# Patient Record
Sex: Female | Born: 1960 | Race: White | Hispanic: No | Marital: Married | Smoking: Never smoker
Health system: Southern US, Community
[De-identification: ages and names within clinical notes are randomized; demographics above are authoritative.]

## PROBLEM LIST (undated history)

## (undated) DIAGNOSIS — G43909 Migraine, unspecified, not intractable, without status migrainosus: Secondary | ICD-10-CM

## (undated) DIAGNOSIS — E039 Hypothyroidism, unspecified: Secondary | ICD-10-CM

## (undated) DIAGNOSIS — R079 Chest pain, unspecified: Secondary | ICD-10-CM

## (undated) DIAGNOSIS — E079 Disorder of thyroid, unspecified: Secondary | ICD-10-CM

## (undated) HISTORY — DX: Hypothyroidism, unspecified: E03.9

## (undated) HISTORY — DX: Migraine, unspecified, not intractable, without status migrainosus: G43.909

## (undated) HISTORY — DX: Disorder of thyroid, unspecified: E07.9

## (undated) HISTORY — DX: Chest pain, unspecified: R07.9

---

## 2012-07-24 ENCOUNTER — Other Ambulatory Visit (HOSPITAL_BASED_OUTPATIENT_CLINIC_OR_DEPARTMENT_OTHER): Payer: Self-pay | Admitting: Family Medicine

## 2012-07-24 ENCOUNTER — Ambulatory Visit (HOSPITAL_BASED_OUTPATIENT_CLINIC_OR_DEPARTMENT_OTHER)
Admission: RE | Admit: 2012-07-24 | Discharge: 2012-07-24 | Disposition: A | Discharge status: Home / Self Care | Payer: BC Managed Care – PPO | Source: Ambulatory Visit | Attending: Family Medicine | Admitting: Family Medicine

## 2012-07-24 DIAGNOSIS — M79609 Pain in unspecified limb: Secondary | ICD-10-CM

## 2012-07-24 DIAGNOSIS — Z1231 Encounter for screening mammogram for malignant neoplasm of breast: Secondary | ICD-10-CM

## 2012-08-01 ENCOUNTER — Other Ambulatory Visit: Payer: Self-pay | Admitting: Family Medicine

## 2012-08-01 DIAGNOSIS — R928 Other abnormal and inconclusive findings on diagnostic imaging of breast: Secondary | ICD-10-CM

## 2012-08-07 ENCOUNTER — Ambulatory Visit
Admission: RE | Admit: 2012-08-07 | Discharge: 2012-08-07 | Disposition: A | Discharge status: Home / Self Care | Payer: BC Managed Care – PPO | Source: Ambulatory Visit | Attending: Family Medicine | Admitting: Family Medicine

## 2012-08-07 DIAGNOSIS — R928 Other abnormal and inconclusive findings on diagnostic imaging of breast: Secondary | ICD-10-CM

## 2013-09-24 ENCOUNTER — Encounter: Payer: Self-pay | Admitting: Cardiovascular Disease

## 2013-09-24 ENCOUNTER — Encounter: Payer: Self-pay | Admitting: *Deleted

## 2013-09-24 ENCOUNTER — Ambulatory Visit (INDEPENDENT_AMBULATORY_CARE_PROVIDER_SITE_OTHER): Payer: BC Managed Care – PPO | Admitting: Cardiovascular Disease

## 2013-09-24 VITALS — BP 120/76 | HR 64 | Ht 64.0 in | Wt 158.8 lb

## 2013-09-24 DIAGNOSIS — IMO0001 Reserved for inherently not codable concepts without codable children: Secondary | ICD-10-CM

## 2013-09-24 DIAGNOSIS — R9431 Abnormal electrocardiogram [ECG] [EKG]: Secondary | ICD-10-CM

## 2013-09-24 DIAGNOSIS — R079 Chest pain, unspecified: Secondary | ICD-10-CM | POA: Insufficient documentation

## 2013-09-24 DIAGNOSIS — R030 Elevated blood-pressure reading, without diagnosis of hypertension: Secondary | ICD-10-CM

## 2013-09-24 DIAGNOSIS — G479 Sleep disorder, unspecified: Secondary | ICD-10-CM

## 2013-09-24 DIAGNOSIS — E039 Hypothyroidism, unspecified: Secondary | ICD-10-CM

## 2013-09-24 LAB — D-DIMER, QUANTITATIVE: D-Dimer, Quant: <0.27 ug/mL-FEU (ref 0.00–0.48)

## 2013-09-24 NOTE — Patient Instructions (Addendum)
Your physician recommends that you schedule a follow-up appointment in: AS NEEDED  Your physician recommends that you continue on your current medications as directed. Please refer to the Current Medication list given to you today.  Your physician recommends that you return for lab work in: D DIMER Your physician has requested that you have a stress echocardiogram. For further information please visit [email or web address] Please follow instruction sheet as given.

## 2013-09-24 NOTE — Assessment & Plan Note (Signed)
No old one to compare anterior leads may be lead position Nonspecific inferior T wave changes  Will assess when she has her stress echo for structural heart disease

## 2013-09-24 NOTE — Assessment & Plan Note (Signed)
Continue replacement TSH with primary

## 2013-09-24 NOTE — Progress Notes (Signed)
Patient ID: Regina King, female   DOB: July 03, 1961, 52 y.o.   MRN: [id number]  52 yo referred by NP Sherrine Maples.  Has had intermitant chest pain.  Started at beginning of December.  Fleeting sharp "piercing pain.  Lasting seconds All episodes are Fleeting some associated with dyspnea  And dizzyness  Father had MI at age 52  Has done a lot of traveling to [place] this year BP elevated when traveling  She is.   under a lot Of stress.  Raising an 11/ and 53 yo.  Mom in [place] had a stroke and she has to call there 3x/day to assist  No smoking, ETOH excess.  No calf pain edema or pleuritic component. She did have left quad pain a few months ago but gone now.   No GI overtones Not positional     ROS: Denies fever, malais, weight loss, blurry vision, decreased visual acuity, cough, sputum, SOB, hemoptysis, pleuritic pain, palpitaitons, heartburn, abdominal pain, melena, lower extremity edema, claudication, or rash.  All other systems reviewed and negative   General: Affect appropriate Healthy:  appears stated age HEENT: normal Neck supple with no adenopathy JVP normal no bruits no thyromegaly Lungs clear with no wheezing and good diaphragmatic motion Heart:  S1/S2 no murmur,rub, gallop or click PMI normal Abdomen: benighn, BS positve, no tenderness, no AAA no bruit.  No HSM or HJR Distal pulses intact with no bruits No edema Neuro non-focal Skin warm and dry No muscular weakness  Medications Current Outpatient Prescriptions  Medication Sig Dispense Refill  . levothyroxine (SYNTHROID, LEVOTHROID) 75 MCG tablet Take 75 mcg by mouth daily before breakfast.       No current facility-administered medications for this visit.    Allergies Asa  Family History: Family History  Problem Relation Age of Onset  . Arrhythmia Mother   . Heart attack Father     Social History: History   Social History  . Marital Status: Married    Spouse Name: N/A    Number of Children: N/A  . Years  of Education: N/A   Occupational History  . Not on file.   Social History Main Topics  . Smoking status: Never Smoker   . Smokeless tobacco: Never Used  . Alcohol Use: No  . Drug Use: No  . Sexual Activity: Not on file   Other Topics Concern  . Not on file   Social History Narrative  . No narrative on file    Electrocardiogram:  SR rate 60 poor R wave progression inferior T wave changes   Assessment and Plan

## 2013-09-24 NOTE — Assessment & Plan Note (Signed)
Seems reactional to stress Normal most of the time  Do not think Rx outside of anxiety/depression needed  Discussed low sodium diet

## 2013-09-24 NOTE — Assessment & Plan Note (Signed)
Very atypical Abnormal ECG.  Check d dimer and stress echo

## 2013-09-25 ENCOUNTER — Telehealth: Payer: Self-pay | Admitting: Cardiovascular Disease

## 2013-09-25 NOTE — Telephone Encounter (Signed)
Follow Up  ° °Pt returned call for results °

## 2013-09-25 NOTE — Telephone Encounter (Signed)
PT  AWARE OF  D DIMER RESULTS .Regina King

## 2013-10-17 ENCOUNTER — Encounter: Payer: Self-pay | Admitting: Internal Medicine

## 2013-10-17 ENCOUNTER — Ambulatory Visit (HOSPITAL_COMMUNITY): Payer: BC Managed Care – PPO | Attending: Cardiovascular Disease

## 2013-10-17 DIAGNOSIS — R42 Dizziness and giddiness: Secondary | ICD-10-CM | POA: Insufficient documentation

## 2013-10-17 DIAGNOSIS — R079 Chest pain, unspecified: Secondary | ICD-10-CM

## 2013-10-17 DIAGNOSIS — R072 Precordial pain: Secondary | ICD-10-CM | POA: Insufficient documentation

## 2013-10-17 DIAGNOSIS — Z8249 Family history of ischemic heart disease and other diseases of the circulatory system: Secondary | ICD-10-CM | POA: Insufficient documentation

## 2013-10-17 DIAGNOSIS — R0609 Other forms of dyspnea: Secondary | ICD-10-CM | POA: Insufficient documentation

## 2013-10-17 DIAGNOSIS — R0989 Other specified symptoms and signs involving the circulatory and respiratory systems: Secondary | ICD-10-CM | POA: Insufficient documentation

## 2013-10-17 NOTE — Progress Notes (Signed)
Stress Echocardiogram performed.  

## 2015-08-17 ENCOUNTER — Other Ambulatory Visit (HOSPITAL_BASED_OUTPATIENT_CLINIC_OR_DEPARTMENT_OTHER): Payer: Self-pay | Admitting: Family Medicine

## 2015-08-17 DIAGNOSIS — R1033 Periumbilical pain: Secondary | ICD-10-CM

## 2015-08-19 ENCOUNTER — Ambulatory Visit (HOSPITAL_BASED_OUTPATIENT_CLINIC_OR_DEPARTMENT_OTHER)
Admission: RE | Admit: 2015-08-19 | Discharge: 2015-08-19 | Disposition: A | Discharge status: Home / Self Care | Payer: BLUE CROSS/BLUE SHIELD | Source: Ambulatory Visit | Attending: Family Medicine | Admitting: Family Medicine

## 2015-08-19 DIAGNOSIS — R932 Abnormal findings on diagnostic imaging of liver and biliary tract: Secondary | ICD-10-CM | POA: Insufficient documentation

## 2015-08-19 DIAGNOSIS — R1905 Periumbilic swelling, mass or lump: Secondary | ICD-10-CM | POA: Insufficient documentation

## 2015-08-19 DIAGNOSIS — R1033 Periumbilical pain: Secondary | ICD-10-CM

## 2015-08-23 ENCOUNTER — Other Ambulatory Visit (HOSPITAL_BASED_OUTPATIENT_CLINIC_OR_DEPARTMENT_OTHER): Payer: Self-pay | Admitting: Family Medicine

## 2015-08-23 DIAGNOSIS — R1909 Other intra-abdominal and pelvic swelling, mass and lump: Secondary | ICD-10-CM

## 2015-09-07 ENCOUNTER — Ambulatory Visit (HOSPITAL_BASED_OUTPATIENT_CLINIC_OR_DEPARTMENT_OTHER): Admission: RE | Admit: 2015-09-07 | Payer: BLUE CROSS/BLUE SHIELD | Source: Ambulatory Visit

## 2015-10-08 ENCOUNTER — Other Ambulatory Visit (HOSPITAL_BASED_OUTPATIENT_CLINIC_OR_DEPARTMENT_OTHER): Payer: Self-pay | Admitting: Family Medicine

## 2015-10-08 DIAGNOSIS — R1900 Intra-abdominal and pelvic swelling, mass and lump, unspecified site: Secondary | ICD-10-CM

## 2015-10-16 ENCOUNTER — Ambulatory Visit (HOSPITAL_BASED_OUTPATIENT_CLINIC_OR_DEPARTMENT_OTHER): Payer: BLUE CROSS/BLUE SHIELD

## 2015-10-23 ENCOUNTER — Ambulatory Visit (HOSPITAL_BASED_OUTPATIENT_CLINIC_OR_DEPARTMENT_OTHER)
Admission: RE | Admit: 2015-10-23 | Discharge: 2015-10-23 | Disposition: A | Discharge status: Home / Self Care | Payer: BLUE CROSS/BLUE SHIELD | Source: Ambulatory Visit | Attending: Family Medicine | Admitting: Family Medicine

## 2015-10-23 ENCOUNTER — Other Ambulatory Visit (HOSPITAL_BASED_OUTPATIENT_CLINIC_OR_DEPARTMENT_OTHER): Payer: Self-pay | Admitting: Family Medicine

## 2015-10-23 DIAGNOSIS — R1900 Intra-abdominal and pelvic swelling, mass and lump, unspecified site: Secondary | ICD-10-CM

## 2015-10-23 DIAGNOSIS — K439 Ventral hernia without obstruction or gangrene: Secondary | ICD-10-CM | POA: Diagnosis not present

## 2016-02-22 DIAGNOSIS — Z23 Encounter for immunization: Secondary | ICD-10-CM | POA: Diagnosis not present

## 2016-08-28 DIAGNOSIS — R05 Cough: Secondary | ICD-10-CM | POA: Diagnosis not present

## 2016-08-28 DIAGNOSIS — R0781 Pleurodynia: Secondary | ICD-10-CM | POA: Diagnosis not present

## 2016-11-22 DIAGNOSIS — E038 Other specified hypothyroidism: Secondary | ICD-10-CM | POA: Diagnosis not present

## 2016-11-22 DIAGNOSIS — E559 Vitamin D deficiency, unspecified: Secondary | ICD-10-CM | POA: Diagnosis not present

## 2017-01-24 DIAGNOSIS — R030 Elevated blood-pressure reading, without diagnosis of hypertension: Secondary | ICD-10-CM | POA: Diagnosis not present

## 2017-01-24 DIAGNOSIS — E039 Hypothyroidism, unspecified: Secondary | ICD-10-CM | POA: Diagnosis not present

## 2017-01-24 DIAGNOSIS — E559 Vitamin D deficiency, unspecified: Secondary | ICD-10-CM | POA: Diagnosis not present

## 2017-08-20 DIAGNOSIS — Z1211 Encounter for screening for malignant neoplasm of colon: Secondary | ICD-10-CM | POA: Diagnosis not present

## 2017-08-20 DIAGNOSIS — K640 First degree hemorrhoids: Secondary | ICD-10-CM | POA: Diagnosis not present

## 2017-08-20 DIAGNOSIS — Z8371 Family history of colonic polyps: Secondary | ICD-10-CM | POA: Diagnosis not present

## 2017-08-20 DIAGNOSIS — K5730 Diverticulosis of large intestine without perforation or abscess without bleeding: Secondary | ICD-10-CM | POA: Diagnosis not present

## 2017-08-24 ENCOUNTER — Other Ambulatory Visit: Payer: Self-pay | Admitting: Family Medicine

## 2017-08-24 DIAGNOSIS — Z139 Encounter for screening, unspecified: Secondary | ICD-10-CM

## 2017-09-20 ENCOUNTER — Ambulatory Visit: Payer: BLUE CROSS/BLUE SHIELD

## 2017-09-20 ENCOUNTER — Ambulatory Visit
Admission: RE | Admit: 2017-09-20 | Discharge: 2017-09-20 | Disposition: A | Discharge status: Home / Self Care | Payer: BLUE CROSS/BLUE SHIELD | Source: Ambulatory Visit | Attending: Family Medicine | Admitting: Family Medicine

## 2017-09-20 DIAGNOSIS — Z139 Encounter for screening, unspecified: Secondary | ICD-10-CM

## 2017-09-20 DIAGNOSIS — Z1231 Encounter for screening mammogram for malignant neoplasm of breast: Secondary | ICD-10-CM | POA: Diagnosis not present

## 2018-01-21 DIAGNOSIS — E559 Vitamin D deficiency, unspecified: Secondary | ICD-10-CM | POA: Diagnosis not present

## 2018-01-21 DIAGNOSIS — E039 Hypothyroidism, unspecified: Secondary | ICD-10-CM | POA: Diagnosis not present

## 2018-01-21 DIAGNOSIS — R011 Cardiac murmur, unspecified: Secondary | ICD-10-CM | POA: Diagnosis not present

## 2018-01-21 DIAGNOSIS — E7800 Pure hypercholesterolemia, unspecified: Secondary | ICD-10-CM | POA: Diagnosis not present

## 2018-02-05 DIAGNOSIS — E7800 Pure hypercholesterolemia, unspecified: Secondary | ICD-10-CM | POA: Diagnosis not present

## 2018-02-07 DIAGNOSIS — R011 Cardiac murmur, unspecified: Secondary | ICD-10-CM | POA: Diagnosis not present

## 2018-05-14 DIAGNOSIS — E7800 Pure hypercholesterolemia, unspecified: Secondary | ICD-10-CM | POA: Diagnosis not present

## 2018-05-14 DIAGNOSIS — E039 Hypothyroidism, unspecified: Secondary | ICD-10-CM | POA: Diagnosis not present

## 2018-05-14 DIAGNOSIS — E559 Vitamin D deficiency, unspecified: Secondary | ICD-10-CM | POA: Diagnosis not present

## 2018-05-20 DIAGNOSIS — H5315 Visual distortions of shape and size: Secondary | ICD-10-CM | POA: Diagnosis not present

## 2018-05-20 DIAGNOSIS — G43B1 Ophthalmoplegic migraine, intractable: Secondary | ICD-10-CM | POA: Diagnosis not present

## 2018-07-24 DIAGNOSIS — E039 Hypothyroidism, unspecified: Secondary | ICD-10-CM | POA: Diagnosis not present

## 2018-07-24 DIAGNOSIS — Z130 Encounter for screening for diseases of the blood and blood-forming organs and certain disorders involving the immune mechanism: Secondary | ICD-10-CM | POA: Diagnosis not present

## 2018-07-24 DIAGNOSIS — E559 Vitamin D deficiency, unspecified: Secondary | ICD-10-CM | POA: Diagnosis not present

## 2018-07-24 DIAGNOSIS — E7800 Pure hypercholesterolemia, unspecified: Secondary | ICD-10-CM | POA: Diagnosis not present

## 2018-11-18 ENCOUNTER — Other Ambulatory Visit: Payer: Self-pay | Admitting: Family Medicine

## 2018-11-18 DIAGNOSIS — Z1231 Encounter for screening mammogram for malignant neoplasm of breast: Secondary | ICD-10-CM

## 2018-12-10 ENCOUNTER — Ambulatory Visit
Admission: RE | Admit: 2018-12-10 | Discharge: 2018-12-10 | Disposition: A | Discharge status: Home / Self Care | Payer: BLUE CROSS/BLUE SHIELD | Source: Ambulatory Visit | Attending: Family Medicine | Admitting: Family Medicine

## 2018-12-10 DIAGNOSIS — Z1231 Encounter for screening mammogram for malignant neoplasm of breast: Secondary | ICD-10-CM

## 2018-12-15 DIAGNOSIS — R079 Chest pain, unspecified: Secondary | ICD-10-CM | POA: Diagnosis not present

## 2018-12-15 DIAGNOSIS — E7800 Pure hypercholesterolemia, unspecified: Secondary | ICD-10-CM | POA: Diagnosis not present

## 2019-02-14 DIAGNOSIS — W57XXXA Bitten or stung by nonvenomous insect and other nonvenomous arthropods, initial encounter: Secondary | ICD-10-CM | POA: Diagnosis not present

## 2019-02-14 DIAGNOSIS — S30861A Insect bite (nonvenomous) of abdominal wall, initial encounter: Secondary | ICD-10-CM | POA: Diagnosis not present

## 2019-03-11 DIAGNOSIS — E039 Hypothyroidism, unspecified: Secondary | ICD-10-CM | POA: Diagnosis not present

## 2019-03-11 DIAGNOSIS — E559 Vitamin D deficiency, unspecified: Secondary | ICD-10-CM | POA: Diagnosis not present

## 2019-03-11 DIAGNOSIS — E7800 Pure hypercholesterolemia, unspecified: Secondary | ICD-10-CM | POA: Diagnosis not present

## 2019-03-11 DIAGNOSIS — Z0189 Encounter for other specified special examinations: Secondary | ICD-10-CM | POA: Diagnosis not present

## 2019-05-16 DIAGNOSIS — M1712 Unilateral primary osteoarthritis, left knee: Secondary | ICD-10-CM | POA: Diagnosis not present

## 2019-06-20 DIAGNOSIS — S83241A Other tear of medial meniscus, current injury, right knee, initial encounter: Secondary | ICD-10-CM | POA: Diagnosis not present

## 2019-07-08 DIAGNOSIS — M25562 Pain in left knee: Secondary | ICD-10-CM | POA: Diagnosis not present

## 2019-07-08 DIAGNOSIS — M1712 Unilateral primary osteoarthritis, left knee: Secondary | ICD-10-CM | POA: Diagnosis not present

## 2019-07-08 DIAGNOSIS — S83232A Complex tear of medial meniscus, current injury, left knee, initial encounter: Secondary | ICD-10-CM | POA: Diagnosis not present

## 2019-07-08 DIAGNOSIS — S83241A Other tear of medial meniscus, current injury, right knee, initial encounter: Secondary | ICD-10-CM | POA: Diagnosis not present

## 2019-07-11 DIAGNOSIS — M1712 Unilateral primary osteoarthritis, left knee: Secondary | ICD-10-CM | POA: Diagnosis not present

## 2019-08-27 DIAGNOSIS — E7800 Pure hypercholesterolemia, unspecified: Secondary | ICD-10-CM | POA: Diagnosis not present

## 2019-08-27 DIAGNOSIS — E039 Hypothyroidism, unspecified: Secondary | ICD-10-CM | POA: Diagnosis not present

## 2020-03-15 DIAGNOSIS — Z1152 Encounter for screening for COVID-19: Secondary | ICD-10-CM | POA: Diagnosis not present

## 2020-03-15 DIAGNOSIS — Z03818 Encounter for observation for suspected exposure to other biological agents ruled out: Secondary | ICD-10-CM | POA: Diagnosis not present

## 2020-03-16 DIAGNOSIS — Z03818 Encounter for observation for suspected exposure to other biological agents ruled out: Secondary | ICD-10-CM | POA: Diagnosis not present

## 2020-04-10 DIAGNOSIS — Z03818 Encounter for observation for suspected exposure to other biological agents ruled out: Secondary | ICD-10-CM | POA: Diagnosis not present

## 2020-05-10 ENCOUNTER — Other Ambulatory Visit: Payer: Self-pay | Admitting: Family Medicine

## 2020-05-10 DIAGNOSIS — Z1231 Encounter for screening mammogram for malignant neoplasm of breast: Secondary | ICD-10-CM

## 2020-05-20 ENCOUNTER — Other Ambulatory Visit: Payer: Self-pay

## 2020-05-20 ENCOUNTER — Ambulatory Visit
Admission: RE | Admit: 2020-05-20 | Discharge: 2020-05-20 | Disposition: A | Discharge status: Home / Self Care | Payer: BLUE CROSS/BLUE SHIELD | Source: Ambulatory Visit | Attending: Family Medicine | Admitting: Family Medicine

## 2020-05-20 ENCOUNTER — Other Ambulatory Visit: Payer: Self-pay | Admitting: Family Medicine

## 2020-05-20 DIAGNOSIS — N6459 Other signs and symptoms in breast: Secondary | ICD-10-CM

## 2020-05-20 DIAGNOSIS — Z1231 Encounter for screening mammogram for malignant neoplasm of breast: Secondary | ICD-10-CM

## 2020-05-24 DIAGNOSIS — S30860A Insect bite (nonvenomous) of lower back and pelvis, initial encounter: Secondary | ICD-10-CM | POA: Diagnosis not present

## 2020-05-24 DIAGNOSIS — W57XXXA Bitten or stung by nonvenomous insect and other nonvenomous arthropods, initial encounter: Secondary | ICD-10-CM | POA: Diagnosis not present

## 2020-05-24 DIAGNOSIS — M2550 Pain in unspecified joint: Secondary | ICD-10-CM | POA: Diagnosis not present

## 2020-06-03 ENCOUNTER — Other Ambulatory Visit: Payer: Self-pay

## 2020-06-03 ENCOUNTER — Ambulatory Visit
Admission: RE | Admit: 2020-06-03 | Discharge: 2020-06-03 | Disposition: A | Discharge status: Home / Self Care | Payer: BC Managed Care – PPO | Source: Ambulatory Visit | Attending: Family Medicine | Admitting: Family Medicine

## 2020-06-03 DIAGNOSIS — N6459 Other signs and symptoms in breast: Secondary | ICD-10-CM

## 2020-06-03 DIAGNOSIS — R922 Inconclusive mammogram: Secondary | ICD-10-CM | POA: Diagnosis not present

## 2020-06-06 DIAGNOSIS — Z20822 Contact with and (suspected) exposure to covid-19: Secondary | ICD-10-CM | POA: Diagnosis not present

## 2020-07-01 DIAGNOSIS — M25561 Pain in right knee: Secondary | ICD-10-CM | POA: Diagnosis not present

## 2020-07-02 DIAGNOSIS — M25561 Pain in right knee: Secondary | ICD-10-CM | POA: Diagnosis not present

## 2020-09-27 IMAGING — MG DIGITAL SCREENING BILATERAL MAMMOGRAM WITH CAD
4 series · 4 of 4 positions shown · non-contrast
Comparison: Previous exam(s).

CLINICAL DATA: Screening.

EXAM:
DIGITAL SCREENING BILATERAL MAMMOGRAM WITH CAD

[R MLO]
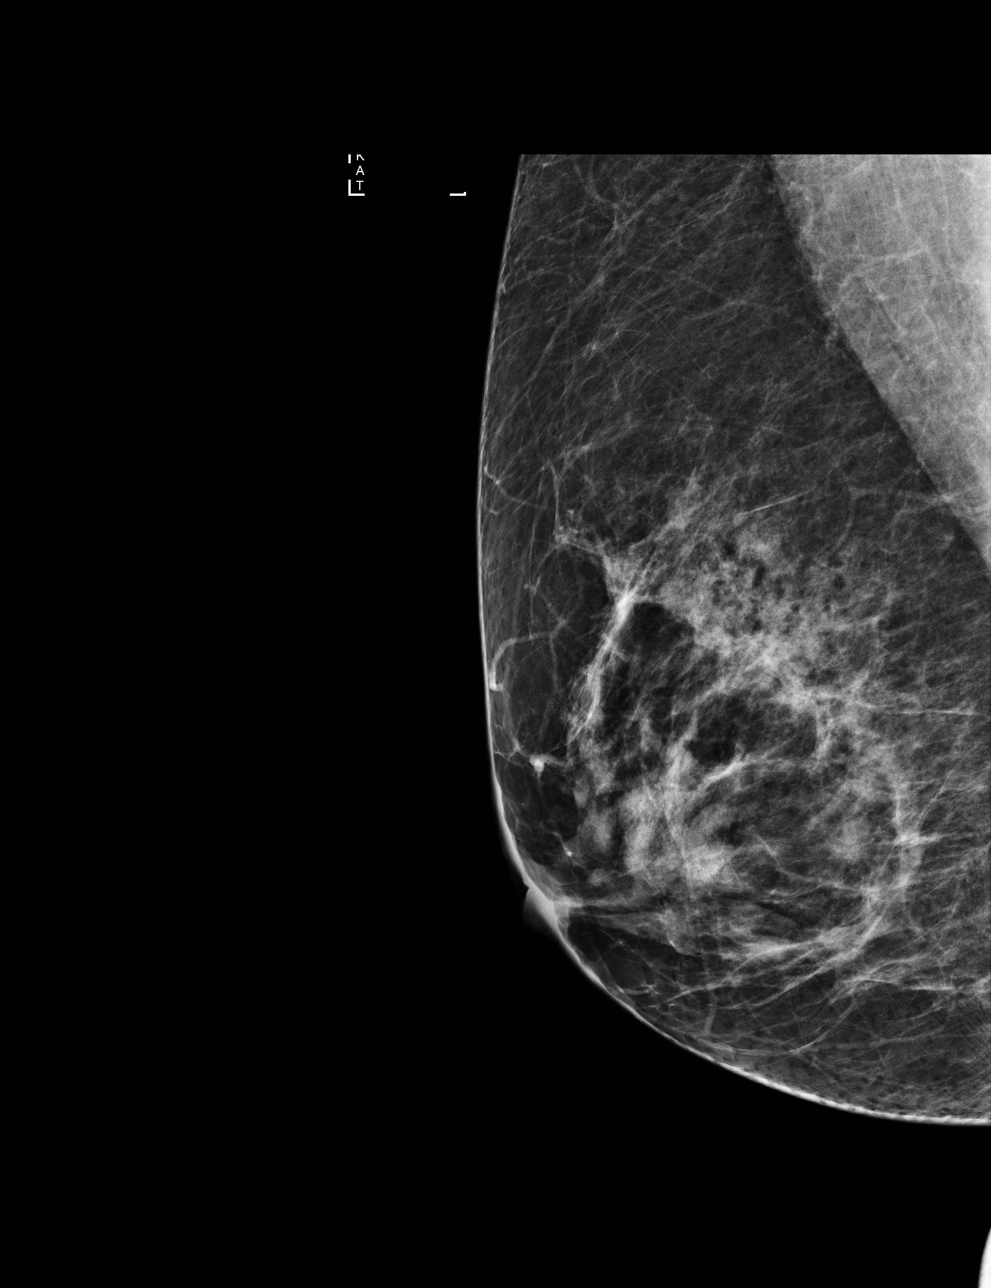

[L MLO]
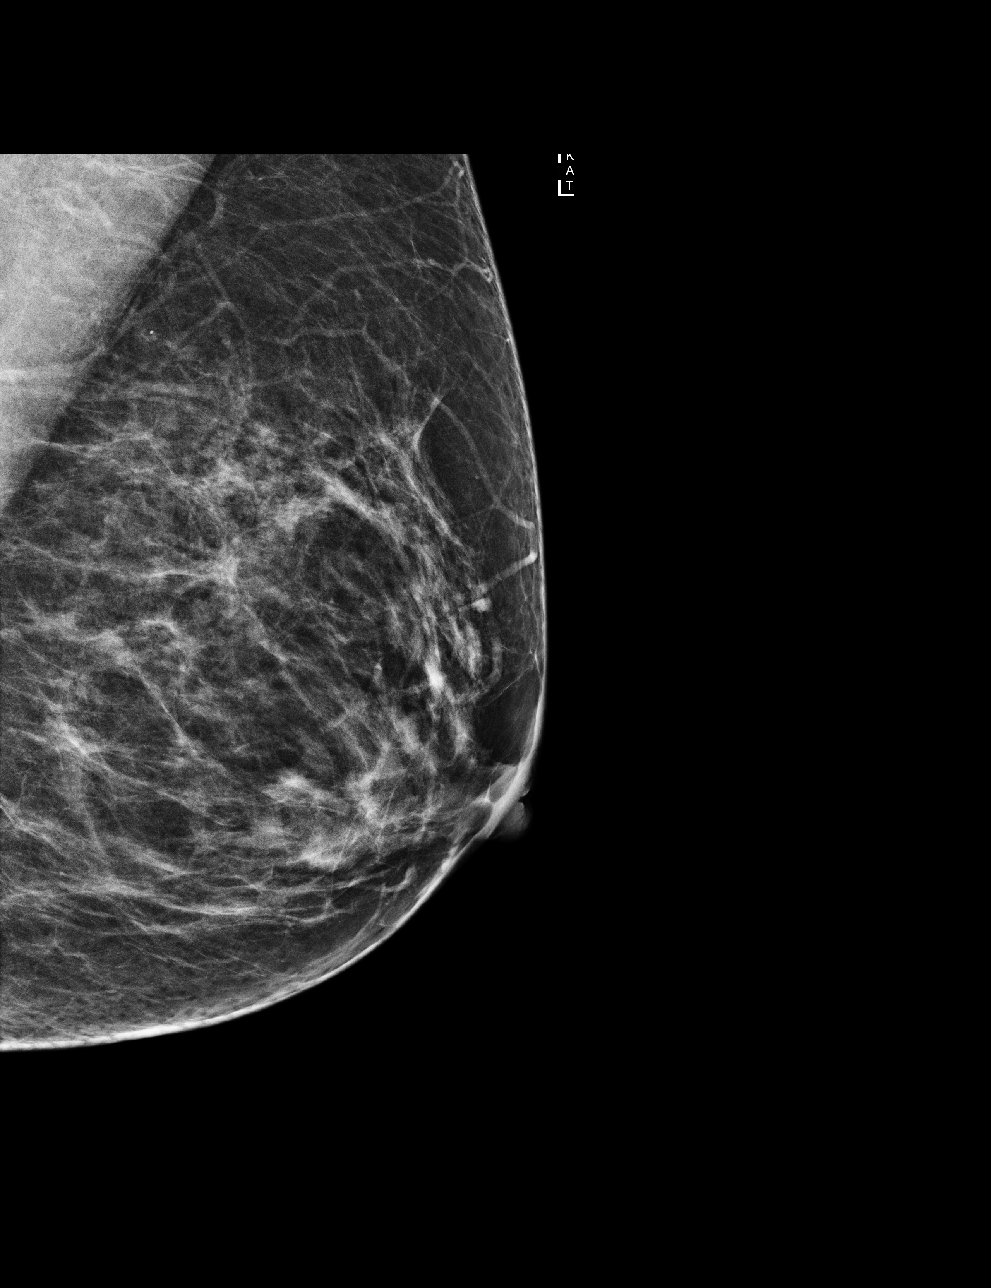

[R CC]
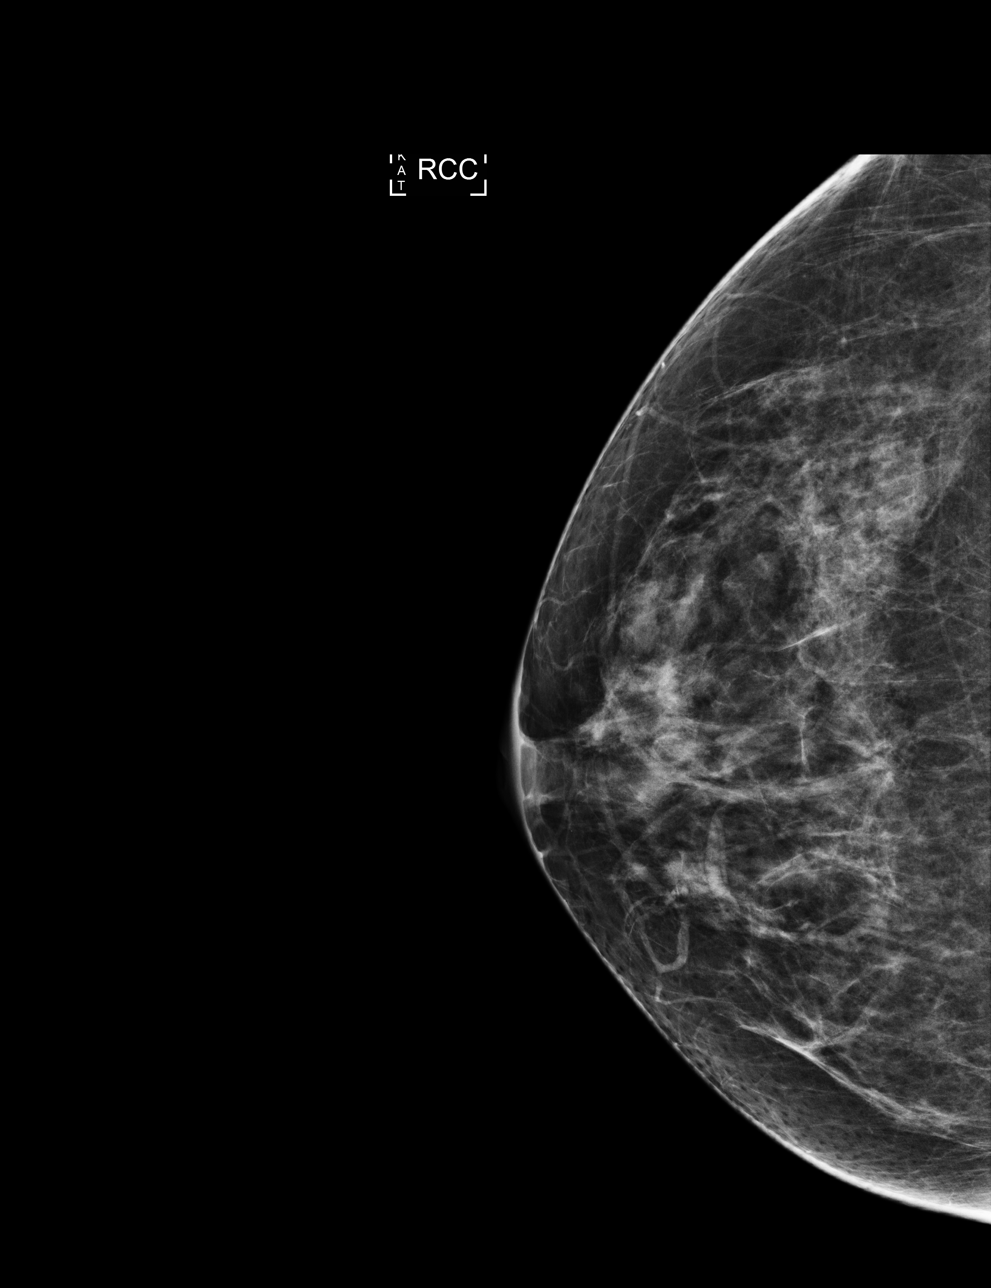

[L CC]
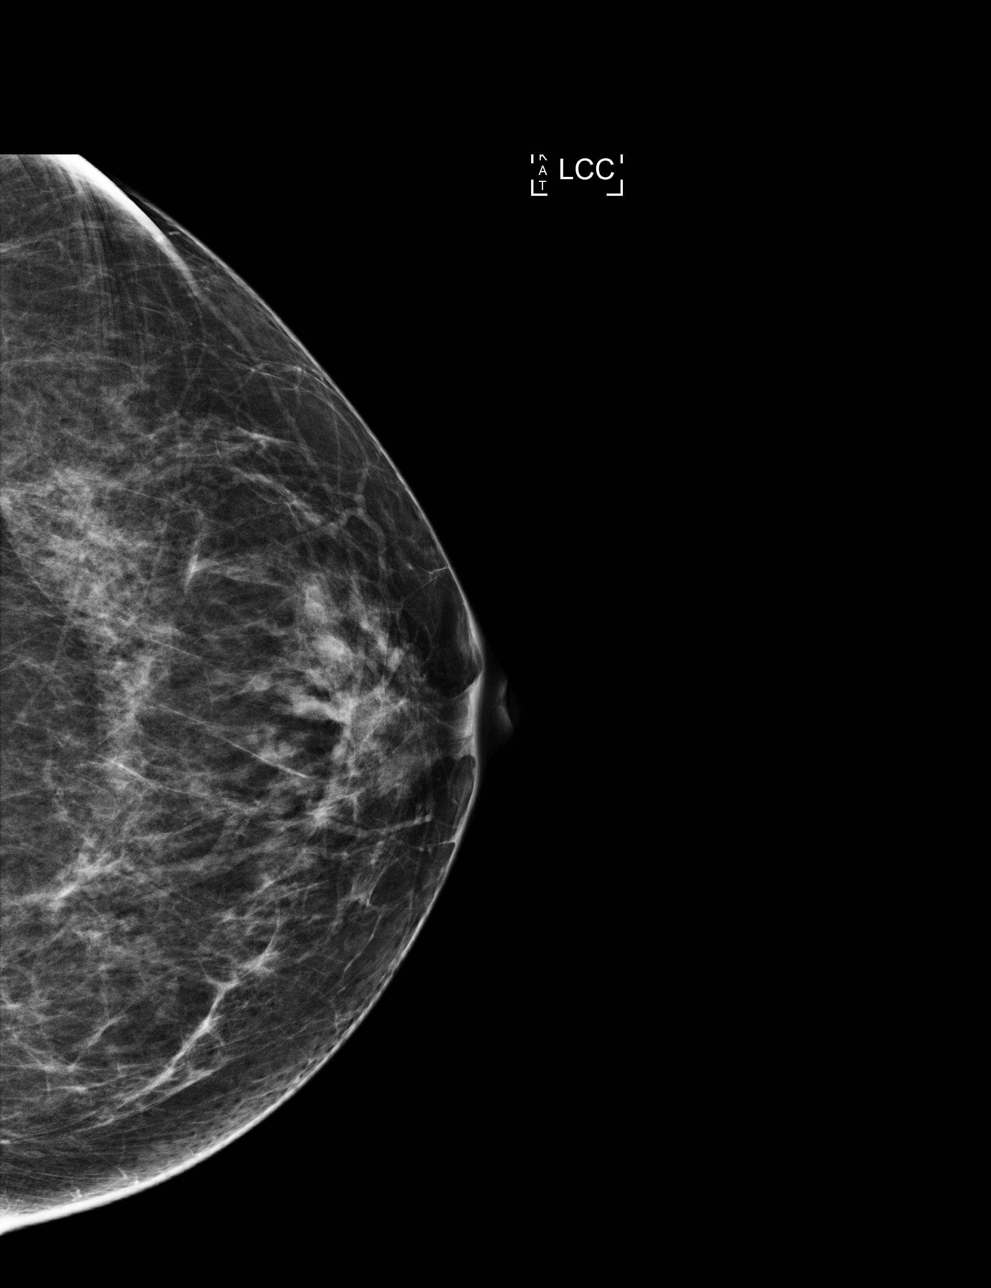

[4 of 4 positions shown; findings below may reference images not displayed]

ACR Breast Density Category b: There are scattered areas of
fibroglandular density.
FINDINGS: There are no findings suspicious for malignancy. Images were
processed with CAD.
IMPRESSION: No mammographic evidence of malignancy. A result letter of this
screening mammogram will be mailed directly to the patient.

RECOMMENDATION:
Screening mammogram in one year. (Code:AS-G-LCT)

BI-RADS CATEGORY  1: Negative.

## 2020-10-17 DIAGNOSIS — Z03818 Encounter for observation for suspected exposure to other biological agents ruled out: Secondary | ICD-10-CM | POA: Diagnosis not present

## 2020-10-17 DIAGNOSIS — Z20822 Contact with and (suspected) exposure to covid-19: Secondary | ICD-10-CM | POA: Diagnosis not present

## 2021-02-17 DIAGNOSIS — Z1211 Encounter for screening for malignant neoplasm of colon: Secondary | ICD-10-CM | POA: Diagnosis not present

## 2021-05-23 DIAGNOSIS — E7800 Pure hypercholesterolemia, unspecified: Secondary | ICD-10-CM | POA: Diagnosis not present

## 2021-05-23 DIAGNOSIS — H9202 Otalgia, left ear: Secondary | ICD-10-CM | POA: Diagnosis not present

## 2021-05-23 DIAGNOSIS — T63461A Toxic effect of venom of wasps, accidental (unintentional), initial encounter: Secondary | ICD-10-CM | POA: Diagnosis not present

## 2021-05-23 DIAGNOSIS — E039 Hypothyroidism, unspecified: Secondary | ICD-10-CM | POA: Diagnosis not present

## 2021-06-29 DIAGNOSIS — Z20822 Contact with and (suspected) exposure to covid-19: Secondary | ICD-10-CM | POA: Diagnosis not present

## 2021-07-11 DIAGNOSIS — Z03818 Encounter for observation for suspected exposure to other biological agents ruled out: Secondary | ICD-10-CM | POA: Diagnosis not present

## 2021-07-11 DIAGNOSIS — Z20822 Contact with and (suspected) exposure to covid-19: Secondary | ICD-10-CM | POA: Diagnosis not present

## 2021-08-03 ENCOUNTER — Other Ambulatory Visit: Payer: Self-pay

## 2021-08-03 ENCOUNTER — Other Ambulatory Visit (HOSPITAL_BASED_OUTPATIENT_CLINIC_OR_DEPARTMENT_OTHER): Payer: Self-pay | Admitting: Family Medicine

## 2021-08-03 ENCOUNTER — Ambulatory Visit (HOSPITAL_BASED_OUTPATIENT_CLINIC_OR_DEPARTMENT_OTHER)
Admission: RE | Admit: 2021-08-03 | Discharge: 2021-08-03 | Disposition: A | Discharge status: Home / Self Care | Payer: BC Managed Care – PPO | Source: Ambulatory Visit | Attending: Family Medicine | Admitting: Family Medicine

## 2021-08-03 DIAGNOSIS — H539 Unspecified visual disturbance: Secondary | ICD-10-CM | POA: Diagnosis not present

## 2021-08-03 DIAGNOSIS — E041 Nontoxic single thyroid nodule: Secondary | ICD-10-CM | POA: Diagnosis not present

## 2021-08-03 DIAGNOSIS — M542 Cervicalgia: Secondary | ICD-10-CM | POA: Diagnosis not present

## 2021-08-16 DIAGNOSIS — H539 Unspecified visual disturbance: Secondary | ICD-10-CM | POA: Diagnosis not present

## 2021-08-16 DIAGNOSIS — R0789 Other chest pain: Secondary | ICD-10-CM | POA: Diagnosis not present

## 2021-08-23 ENCOUNTER — Ambulatory Visit (HOSPITAL_BASED_OUTPATIENT_CLINIC_OR_DEPARTMENT_OTHER): Payer: BC Managed Care – PPO | Admitting: Cardiology

## 2021-08-23 ENCOUNTER — Other Ambulatory Visit: Payer: Self-pay

## 2021-08-23 ENCOUNTER — Encounter (HOSPITAL_BASED_OUTPATIENT_CLINIC_OR_DEPARTMENT_OTHER): Payer: Self-pay | Admitting: Cardiology

## 2021-08-23 VITALS — BP 126/80 | HR 64 | Ht 64.0 in | Wt 140.0 lb

## 2021-08-23 DIAGNOSIS — Z8249 Family history of ischemic heart disease and other diseases of the circulatory system: Secondary | ICD-10-CM | POA: Diagnosis not present

## 2021-08-23 DIAGNOSIS — R072 Precordial pain: Secondary | ICD-10-CM

## 2021-08-23 DIAGNOSIS — Z01812 Encounter for preprocedural laboratory examination: Secondary | ICD-10-CM | POA: Diagnosis not present

## 2021-08-23 DIAGNOSIS — R079 Chest pain, unspecified: Secondary | ICD-10-CM

## 2021-08-23 DIAGNOSIS — H539 Unspecified visual disturbance: Secondary | ICD-10-CM

## 2021-08-23 DIAGNOSIS — R42 Dizziness and giddiness: Secondary | ICD-10-CM

## 2021-08-23 DIAGNOSIS — E7800 Pure hypercholesterolemia, unspecified: Secondary | ICD-10-CM

## 2021-08-23 DIAGNOSIS — Z7189 Other specified counseling: Secondary | ICD-10-CM

## 2021-08-23 MED ORDER — METOPROLOL TARTRATE 25 MG PO TABS: ORAL_TABLET | ORAL | 0 refills | Status: DC
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
## 2021-08-23 NOTE — Patient Instructions (Signed)
Medication Instructions:  Your Physician recommend you continue on your current medication as directed.    *If you need a refill on your cardiac medications before your next appointment, please call your pharmacy*   Lab Work: Your provider has recommended lab work today (BMP). Please have this collected at [facility] [place]. The lab is open 8:00 am - 4:30 pm. Please avoid 12:00p - 1:00p for lunch hour. You do not need an appointment. Please go to [address] [place], [place] [address]. This is in the Primary Care office on the 3rd floor, let them know you are there for blood work and they will direct you to the lab.  If you have labs (blood work) drawn today and your tests are completely normal, you will receive your results only by: MyChart Message (if you have MyChart) OR A paper copy in the mail If you have any lab test that is abnormal or we need to change your treatment, we will call you to review the results.   Testing/Procedures: Cardiac CT Angiography (CTA), is a special type of CT scan that uses a computer to produce multi-dimensional views of major blood vessels throughout the body. In CT angiography, a contrast material is injected through an IV to help visualize the blood vessels  [facility]  Follow-Up: At [facility], you and your health needs are our priority.  As part of our continuing mission to provide you with exceptional heart care, we have created designated Provider Care Teams.  These Care Teams include your primary Cardiologist (physician) and Advanced Practice Providers (APPs -  Physician Assistants and Nurse Practitioners) who all work together to provide you with the care you need, when you need it.  We recommend signing up for the patient portal called "MyChart".  Sign up information is provided on this After Visit Summary.  MyChart is used to connect with patients for Virtual Visits (Telemedicine).  Patients are able to  view lab/test results, encounter notes, upcoming appointments, etc.  Non-urgent messages can be sent to your provider as well.   To learn more about what you can do with MyChart, go to [email or web address].    Your next appointment:   Based on test results  The format for your next appointment:   In Person  Provider:   Jodelle Red, MD     Your cardiac CT will be scheduled at one of the below locations:   [facility] [address] [place], [place] [address] [phone]  If scheduled at [facility], please arrive at the [facility] main entrance (entrance A) of [facility] 30 minutes prior to test start time. You can use the FREE valet parking offered at the main entrance (encouraged to control the heart rate for the test) Proceed to the [facility] Radiology Department (first floor) to check-in and test prep.  If scheduled at [facility], please arrive 15 mins early for check-in and test prep.  Please follow these instructions carefully (unless otherwise directed):   On the Night Before the Test: Be sure to Drink plenty of water. Do not consume any caffeinated/decaffeinated beverages or chocolate 12 hours prior to your test. Do not take any antihistamines 12 hours prior to your test.   On the Day of the Test: Drink plenty of water until 1 hour prior to the test. Do not eat any food 4 hours prior to the test. You may take your regular medications prior to  the test.  Take metoprolol (Lopressor) 25 mg two hours prior to test. FEMALES- please wear underwire-free bra if available, avoid dresses & tight clothing        After the Test: Drink plenty of water. After receiving IV contrast, you may experience a mild flushed feeling. This is normal. On occasion, you may experience a mild rash up to 24 hours after the test. This is not dangerous. If this occurs, you can take Benadryl 25 mg and increase your fluid  intake. If you experience trouble breathing, this can be serious. If it is severe call 911 IMMEDIATELY. If it is mild, please call our office. If you take any of these medications: Glipizide/Metformin, Avandament, Glucavance, please do not take 48 hours after completing test unless otherwise instructed.  Please allow 2-4 weeks for scheduling of routine cardiac CTs. Some insurance companies require a pre-authorization which may delay scheduling of this test.   For non-scheduling related questions, please contact the cardiac imaging nurse navigator should you have any questions/concerns: Rockwell Alexandria, Cardiac Imaging Nurse Navigator Larey Brick, Cardiac Imaging Nurse Navigator [facility] Direct Office Dial: [phone]   For scheduling needs, including cancellations and rescheduling, please call [place], [phone].

## 2021-08-24 LAB — BASIC METABOLIC PANEL
BUN/Creatinine Ratio: 22 (ref 12–28)
BUN: 17 mg/dL (ref 8–27)
CO2: 23 mmol/L (ref 20–29)
Calcium: 9.5 mg/dL (ref 8.7–10.3)
Chloride: 104 mmol/L (ref 96–106)
Creatinine, Ser: 0.78 mg/dL (ref 0.57–1.00)
Glucose: 85 mg/dL (ref 70–99)
Potassium: 4.9 mmol/L (ref 3.5–5.2)
Sodium: 142 mmol/L (ref 134–144)
eGFR: 87 mL/min/{1.73_m2} (ref >59)

## 2021-08-25 ENCOUNTER — Encounter (HOSPITAL_BASED_OUTPATIENT_CLINIC_OR_DEPARTMENT_OTHER): Payer: Self-pay

## 2021-08-25 ENCOUNTER — Telehealth (HOSPITAL_COMMUNITY): Payer: Self-pay | Admitting: *Deleted

## 2021-08-25 DIAGNOSIS — R079 Chest pain, unspecified: Secondary | ICD-10-CM

## 2021-08-25 NOTE — Telephone Encounter (Signed)
Returning patient's call regarding cardiac CT scan.  She had concerns about the safety of contrast dye and radiation exposure.  Questions answered to best of  my ability but advised patient to discuss with Dr. Cristal Deer regarding risk/benefit of pursuing cardiac CT scan. Patient thankful for phone call.  Larey Brick, RN nurse nagivator Cardiac Imaging  [phone]

## 2021-08-26 NOTE — Telephone Encounter (Signed)
I reviewed the mychart message and understand the concern. Can you call and see if she would prefer a treadmill stress test? We did discuss this briefly at her visit. Thanks.

## 2021-08-30 ENCOUNTER — Other Ambulatory Visit (HOSPITAL_BASED_OUTPATIENT_CLINIC_OR_DEPARTMENT_OTHER): Payer: Self-pay | Admitting: Cardiology

## 2021-08-30 ENCOUNTER — Encounter (HOSPITAL_BASED_OUTPATIENT_CLINIC_OR_DEPARTMENT_OTHER): Payer: Self-pay

## 2021-08-30 DIAGNOSIS — R079 Chest pain, unspecified: Secondary | ICD-10-CM

## 2021-08-30 NOTE — Progress Notes (Signed)
See mychart notes; prefers to not pursue CT coronary and instead pursue treadmill. We discussed this test at her visit.  Shared Decision Making/Informed Consent The risks [chest pain, shortness of breath, cardiac arrhythmias, dizziness, blood pressure fluctuations, myocardial infarction, stroke/transient ischemic attack, and life-threatening complications (estimated to be 1 in 10,000)], benefits (risk stratification, diagnosing coronary artery disease, treatment guidance) and alternatives of an exercise tolerance test were discussed in detail with Regina King and she agrees to proceed.

## 2021-09-05 ENCOUNTER — Ambulatory Visit (HOSPITAL_COMMUNITY): Admission: RE | Admit: 2021-09-05 | Payer: BC Managed Care – PPO | Source: Ambulatory Visit

## 2021-09-06 ENCOUNTER — Telehealth (HOSPITAL_COMMUNITY): Payer: Self-pay | Admitting: *Deleted

## 2021-09-06 NOTE — Telephone Encounter (Signed)
Close encounter 

## 2021-09-07 ENCOUNTER — Other Ambulatory Visit: Payer: Self-pay

## 2021-09-07 ENCOUNTER — Ambulatory Visit (HOSPITAL_COMMUNITY)
Admission: RE | Admit: 2021-09-07 | Discharge: 2021-09-07 | Disposition: A | Discharge status: Home / Self Care | Payer: BC Managed Care – PPO | Source: Ambulatory Visit | Attending: Cardiology | Admitting: Cardiology

## 2021-09-07 DIAGNOSIS — R079 Chest pain, unspecified: Secondary | ICD-10-CM | POA: Diagnosis not present

## 2021-09-07 LAB — EXERCISE TOLERANCE TEST
Estimated workload: 13.4
Exercise duration (min): 11 min
Exercise duration (sec): 0 s
MPHR: 160 {beats}/min
Peak HR: 148 {beats}/min
Percent HR: 92 %
Rest HR: 63 {beats}/min

## 2021-09-19 ENCOUNTER — Ambulatory Visit (HOSPITAL_BASED_OUTPATIENT_CLINIC_OR_DEPARTMENT_OTHER): Payer: BC Managed Care – PPO | Admitting: Cardiology
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
## 2021-09-30 ENCOUNTER — Other Ambulatory Visit: Payer: Self-pay

## 2021-09-30 ENCOUNTER — Encounter (HOSPITAL_BASED_OUTPATIENT_CLINIC_OR_DEPARTMENT_OTHER): Payer: Self-pay | Admitting: Cardiology

## 2021-09-30 ENCOUNTER — Ambulatory Visit (HOSPITAL_BASED_OUTPATIENT_CLINIC_OR_DEPARTMENT_OTHER): Payer: BC Managed Care – PPO | Admitting: Cardiology

## 2021-09-30 VITALS — BP 122/70 | HR 67 | Ht 64.0 in | Wt 140.8 lb

## 2021-09-30 DIAGNOSIS — Z7189 Other specified counseling: Secondary | ICD-10-CM | POA: Diagnosis not present

## 2021-09-30 DIAGNOSIS — R079 Chest pain, unspecified: Secondary | ICD-10-CM | POA: Diagnosis not present

## 2021-09-30 DIAGNOSIS — E7800 Pure hypercholesterolemia, unspecified: Secondary | ICD-10-CM

## 2021-09-30 DIAGNOSIS — Z01812 Encounter for preprocedural laboratory examination: Secondary | ICD-10-CM

## 2021-09-30 DIAGNOSIS — R072 Precordial pain: Secondary | ICD-10-CM

## 2021-09-30 NOTE — Patient Instructions (Signed)
Medication Instructions:  Your Physician recommend you continue on your current medication as directed.    *If you need a refill on your cardiac medications before your next appointment, please call your pharmacy*   Lab Work: None ordered today   Testing/Procedures: Cardiac CT Angiography (CTA), is a special type of CT scan that uses a computer to produce multi-dimensional views of major blood vessels throughout the body. In CT angiography, a contrast material is injected through an IV to help visualize the blood vessels [facility]   Follow-Up: At [facility], you and your health needs are our priority.  As part of our continuing mission to provide you with exceptional heart care, we have created designated Provider Care Teams.  These Care Teams include your primary Cardiologist (physician) and Advanced Practice Providers (APPs -  Physician Assistants and Nurse Practitioners) who all work together to provide you with the care you need, when you need it.  We recommend signing up for the patient portal called "MyChart".  Sign up information is provided on this After Visit Summary.  MyChart is used to connect with patients for Virtual Visits (Telemedicine).  Patients are able to view lab/test results, encounter notes, upcoming appointments, etc.  Non-urgent messages can be sent to your provider as well.   To learn more about what you can do with MyChart, go to [email or web address].    Your next appointment:   6 month(s)  The format for your next appointment:   In Person  Provider:   Jodelle Red, MD     Your cardiac CT will be scheduled at one of the below locations:   [facility] [address] [place], [place] [address] [phone]  If scheduled at [facility], please arrive at the [facility] main entrance (entrance A) of [facility] 30 minutes prior to test start time. You can use the FREE valet parking offered at the  main entrance (encouraged to control the heart rate for the test) Proceed to the [facility] Radiology Department (first floor) to check-in and test prep.  If scheduled at [facility], please arrive 15 mins early for check-in and test prep.  Please follow these instructions carefully (unless otherwise directed):  On the Night Before the Test: Be sure to Drink plenty of water. Do not consume any caffeinated/decaffeinated beverages or chocolate 12 hours prior to your test. Do not take any antihistamines 12 hours prior to your test.   On the Day of the Test: Drink plenty of water until 1 hour prior to the test. Do not eat any food 4 hours prior to the test. You may take your regular medications prior to the test.  FEMALES- please wear underwire-free bra if available, avoid dresses & tight clothing        After the Test: Drink plenty of water. After receiving IV contrast, you may experience a mild flushed feeling. This is normal. On occasion, you may experience a mild rash up to 24 hours after the test. This is not dangerous. If this occurs, you can take Benadryl 25 mg and increase your fluid intake. If you experience trouble breathing, this can be serious. If it is severe call 911 IMMEDIATELY. If it is mild, please call our office. If you take any of these medications: Glipizide/Metformin, Avandament, Glucavance, please do not take 48 hours after completing test unless otherwise instructed.  Please allow 2-4 weeks for scheduling of routine cardiac CTs. Some insurance companies require a pre-authorization  which may delay scheduling of this test.   For non-scheduling related questions, please contact the cardiac imaging nurse navigator should you have any questions/concerns: Rockwell Alexandria, Cardiac Imaging Nurse Navigator Larey Brick, Cardiac Imaging Nurse Navigator [place] [facility] Direct Office Dial: [phone]   For scheduling needs,  including cancellations and rescheduling, please call [place], [phone].

## 2021-09-30 NOTE — Progress Notes (Signed)
Cardiology Office Note:    Date:  09/30/2021   ID:  Regina King, DOB May 13, 1961, MRN [id number]  PCP:  Glenford Bayley, DO  Cardiologist:  Buford Dresser, MD  Referring MD: Glenford Bayley, DO   CC: follow up abnormal stress test  History of Present Illness:    Regina King is a 60 y.o. female with a hx of hypothyroidism, who is seen for follow up. I initially met her 09/19/21 as a new consult at the request of Le, Thao P, DO for the evaluation and management of chest pain and palpitations.  Today: We discussed her abnormal stress test today. We discussed options, including nuclear stress, echo stress, cardiac CTA, and heart cath. We discussed radiation risk and contrast dye at length.   We also discussed guideline recommendations for medical management, including aspirin and statin.  She did have more stabbing chest pain while in [place] recently, which self resolved.  We spent extensive time today discussing pros/cons. She is very concerned about rare side effects. She does not think there is much in terms of results that would change her preferences regarding treatment.   Past Medical History:  Diagnosis Date   Chest pain    Hypothyroidism    Migraine    Thyroid disease     No past surgical history on file.  Current Medications: Current Outpatient Medications on File Prior to Visit  Medication Sig   Cholecalciferol (VITAMIN D3 PO) Take 2 tablets by mouth daily.   levothyroxine (SYNTHROID) 88 MCG tablet Take 88 mcg by mouth daily before breakfast.   No current facility-administered medications on file prior to visit.     Allergies:   Asa [aspirin] and Peanut butter flavor   Social History   Tobacco Use   Smoking status: Never   Smokeless tobacco: Never  Substance Use Topics   Alcohol use: No   Drug use: No    Family History: family history includes Arrhythmia in her mother; Heart attack in her father.  ROS:   Please see the history of present illness.   Additional pertinent ROS otherwise unremarkable.   EKGs/Labs/Other Studies Reviewed:    The following studies were reviewed today: ETT 09/30/2021   Patient exercised according to the CVN-BRUCE for 11:58mn achieving 13.4 METs   Resting HR was 63bpm and rose to a maximal HR of 148bpm which represents 92% of max, age-predicted HR   There were up sloping ST depression in the inferior and inferolateral leads was noted that resolved within 1 minute of recovery   Equivocal stress ECG due to upsloping ST-depressions during stress. Overall excellent exercise capacity. Consider imaging (lexiscan vs coronary CTA) for further ischemic work-up.  Bilateral Carotid Duplex 08/03/2021: FINDINGS: Criteria: Quantification of carotid stenosis is based on velocity parameters that correlate the residual internal carotid diameter with NASCET-based stenosis levels, using the diameter of the distal internal carotid lumen as the denominator for stenosis measurement.   The following velocity measurements were obtained:   RIGHT ICA: 60/27 cm/sec CCA: 733/82cm/sec  SYSTOLIC ICA/CCA RATIO:  05.05 ECA:  67 cm/sec   LEFT  ICA: 67/28 cm/sec  CCA: 639/76cm/sec  SYSTOLIC ICA/CCA RATIO:  1.0  ECA:  49 cm/sec   RIGHT CAROTID ARTERY: No significant atherosclerotic plaque or evidence of stenosis in the internal carotid artery.   RIGHT VERTEBRAL ARTERY:  Patent with normal antegrade flow.   LEFT CAROTID ARTERY: No significant atherosclerotic plaque or evidence of stenosis in the internal carotid artery.  LEFT VERTEBRAL ARTERY:  Patent with normal antegrade flow.   Other: Incidental note is made of a small subcentimeter hypoechoic nodule within the left thyroid gland. This is considered an incidental finding and no further follow-up imaging or additional imaging is required.   IMPRESSION: 1. No significant atherosclerotic plaque or evidence of stenosis in either internal carotid artery. 2. Vertebral  arteries are patent with normal antegrade flow.  Echo report from [facility] 5.2.2019 Normal LVEF, mild LVH, normal wall motion, EF 63% AoV: mild calcification of NCC. No stenosis. Moderate AI TV: mild to moderate regurgitation No evidence of pulmonary hypertension  Stress echo 2015 Study Conclusions   - Stress ECG conclusions: Nonspecific upsloping ST    depression in anterolateral and inferior leads.  - Staged echo: Normal echo stress  Impressions:   - Normal study after maximal exercise. Good exercise    tolerance.  Bruce protocol. Stress echocardiography.  Height:  Height:  162.6cm. Height: 64in.  Weight:  Weight: 71.8kg. Weight:  158lb.  Body mass index:  BMI: 27.2kg/m^2.  Body surface  area:    BSA: 1.68m2.  Blood pressure:     131/99.  Patient  status:  Outpatient.   EKG:  EKG is personally reviewed.   08/23/2021: NSR at 64 bpm   Recent Labs: 08/23/2021: BUN 17; Creatinine, Ser 0.78; Potassium 4.9; Sodium 142   Recent Lipid Panel No results found for: CHOL, TRIG, HDL, CHOLHDL, VLDL, LDLCALC, LDLDIRECT  Physical Exam:    VS:  BP 122/70 (BP Location: Right Arm)    Pulse 67    Ht '5\' 4"'  (1.626 m)    Wt 140 lb 12.8 oz (63.9 kg)    LMP 06/27/2012    SpO2 98%    BMI 24.17 kg/m     Wt Readings from Last 3 Encounters:  09/30/21 140 lb 12.8 oz (63.9 kg)  08/23/21 140 lb (63.5 kg)  09/24/13 158 lb 12.8 oz (72 kg)    GEN: Well nourished, well developed in no acute distress HEENT: Normal, moist mucous membranes NECK: No JVD CARDIAC: regular rhythm, normal S1 and S2, no rubs or gallops. 2/6 systolic murmur. VASCULAR: Radial and DP pulses 2+ bilaterally. No carotid bruits RESPIRATORY:  Clear to auscultation without rales, wheezing or rhonchi  ABDOMEN: Soft, non-tender, non-distended MUSCULOSKELETAL:  Ambulates independently SKIN: Warm and dry, no edema NEUROLOGIC:  Alert and oriented x 3. No focal neuro deficits noted. PSYCHIATRIC:  Normal affect     ASSESSMENT:    1. Chest pain of uncertain etiology   2. Cardiac risk counseling   3. Pure hypercholesterolemia   4. Counseling on health promotion and disease prevention   5. Precordial pain   6. Pre-procedure lab exam     PLAN:    Chest pain of uncertain etiology Family history of heart disease Hypercholesterolemia Abnormal exercise treadmill stress test -we discussed her test results at length. She is concerned about radiation and iodinated contrast. We discussed pros and cons together. After shared decision making, she would like to proceed with CT coronary angiography -HR 67, can get IV metoprolol if needed -counseled on need to get BMET prior to test -counseled on use of sublingual nitroglycerin and its importance to a good test  -if there is evidence of CAD, given her cholesterol and family history, I would recommend a statin -[facility]: Tchol 226, HDL 64, LDL 148, TG 80  Dizziness Intermittent visual disturbances -carotid ultrasounds unremarkable -recommend eye exam -if acute severe symptoms occur, would recommend  ER for possible MRI  Palpitations: none recently, infrequent, no high risk features  Cardiac risk counseling and prevention recommendations: -recommend heart healthy/Mediterranean diet, with whole grains, fruits, vegetable, fish, lean meats, nuts, and olive oil. Limit salt. -recommend moderate walking, 3-5 times/week for 30-50 minutes each session. Aim for at least 150 minutes.week. Goal should be pace of 3 miles/hours, or walking 1.5 miles in 30 minutes -recommend avoidance of tobacco products. Avoid excess alcohol. -ASCVD risk score: The ASCVD Risk score (Arnett DK, et al., 2019) failed to calculate for the following reasons:   Cannot find a previous HDL lab   Cannot find a previous total cholesterol lab    Plan for follow up: 6 mos or sooner based on results of testing  Buford Dresser, MD, PhD, [facility]     Medication Adjustments/Labs and Tests Ordered: Current medicines are reviewed at length with the patient today.  Concerns regarding medicines are outlined above.   Orders Placed This Encounter  Procedures   CT CORONARY MORPH W/CTA COR W/SCORE W/CA W/CM &/OR WO/CM   Basic metabolic panel   Lipid panel    No orders of the defined types were placed in this encounter.   Patient Instructions  Medication Instructions:  Your Physician recommend you continue on your current medication as directed.    *If you need a refill on your cardiac medications before your next appointment, please call your pharmacy*   Lab Work: None ordered today   Testing/Procedures: Cardiac CT Angiography (CTA), is a special type of CT scan that uses a computer to produce multi-dimensional views of major blood vessels throughout the body. In CT angiography, a contrast material is injected through an IV to help visualize the blood vessels [facility]   Follow-Up: At [facility], you and your health needs are our priority.  As part of our continuing mission to provide you with exceptional heart care, we have created designated Provider Care Teams.  These Care Teams include your primary Cardiologist (physician) and Advanced Practice Providers (APPs -  Physician Assistants and Nurse Practitioners) who all work together to provide you with the care you need, when you need it.  We recommend signing up for the patient portal called "MyChart".  Sign up information is provided on this After Visit Summary.  MyChart is used to connect with patients for Virtual Visits (Telemedicine).  Patients are able to view lab/test results, encounter notes, upcoming appointments, etc.  Non-urgent messages can be sent to your provider as well.   To learn more about what you can do with MyChart, go to [email or web address].    Your next appointment:   6 month(s)  The format for your next appointment:   In  Person  Provider:   Buford Dresser, MD     Your cardiac CT will be scheduled at one of the below locations:   [facility] [address] [place], [place] [address] [phone]  If scheduled at [facility], please arrive at the [facility] main entrance (entrance A) of [facility] 30 minutes prior to test start time. You can use the FREE valet parking offered at the main entrance (encouraged to control the heart rate for the test) Proceed to the [facility] Radiology Department (first floor) to check-in and test prep.  If scheduled at [facility], please arrive 15 mins early for check-in and test prep.  Please follow these instructions carefully (unless otherwise directed):  On  the Night Before the Test: Be sure to Drink plenty of water. Do not consume any caffeinated/decaffeinated beverages or chocolate 12 hours prior to your test. Do not take any antihistamines 12 hours prior to your test.   On the Day of the Test: Drink plenty of water until 1 hour prior to the test. Do not eat any food 4 hours prior to the test. You may take your regular medications prior to the test.  FEMALES- please wear underwire-free bra if available, avoid dresses & tight clothing        After the Test: Drink plenty of water. After receiving IV contrast, you may experience a mild flushed feeling. This is normal. On occasion, you may experience a mild rash up to 24 hours after the test. This is not dangerous. If this occurs, you can take Benadryl 25 mg and increase your fluid intake. If you experience trouble breathing, this can be serious. If it is severe call 911 IMMEDIATELY. If it is mild, please call our office. If you take any of these medications: Glipizide/Metformin, Avandament, Glucavance, please do not take 48 hours after completing test unless otherwise instructed.  Please allow 2-4 weeks for scheduling of routine cardiac CTs.  Some insurance companies require a pre-authorization which may delay scheduling of this test.   For non-scheduling related questions, please contact the cardiac imaging nurse navigator should you have any questions/concerns: Marchia Bond, Cardiac Imaging Nurse Navigator Gordy Clement, Cardiac Imaging Nurse Navigator [facility] Direct Office Dial: [phone]   For scheduling needs, including cancellations and rescheduling, please call [place], [phone].      Signed, Buford Dresser, MD PhD 09/30/2021 10:28 AM    [place]

## 2021-10-13 ENCOUNTER — Telehealth (HOSPITAL_COMMUNITY): Payer: Self-pay | Admitting: Emergency Medicine

## 2021-10-13 ENCOUNTER — Encounter (HOSPITAL_COMMUNITY): Payer: Self-pay

## 2021-10-13 DIAGNOSIS — R079 Chest pain, unspecified: Secondary | ICD-10-CM

## 2021-10-13 MED ORDER — METOPROLOL TARTRATE 25 MG PO TABS: 25.0000 mg | ORAL_TABLET | Freq: Once | ORAL | 0 refills | Status: DC

## 2021-10-13 NOTE — Telephone Encounter (Signed)
Reaching out to patient to offer assistance regarding upcoming cardiac imaging study; pt verbalizes understanding of appt date/time, parking situation and where to check in, pre-test NPO status and medications ordered, and verified current allergies; name and call back number provided for further questions should they arise Rockwell Alexandria RN Navigator Cardiac Imaging [facility] [phone] office [phone] cell  Reminded patient to get labs. Shes still hesitant to have CCTA done. I represcribed her metoprolol to her pharmacy. I encouraged her to phone back if she has a change of heart and doesn't want to have test done. [place] Dec

## 2021-10-17 DIAGNOSIS — E7800 Pure hypercholesterolemia, unspecified: Secondary | ICD-10-CM | POA: Diagnosis not present

## 2021-10-17 DIAGNOSIS — Z01812 Encounter for preprocedural laboratory examination: Secondary | ICD-10-CM | POA: Diagnosis not present

## 2021-10-18 ENCOUNTER — Other Ambulatory Visit: Payer: Self-pay

## 2021-10-18 ENCOUNTER — Ambulatory Visit (HOSPITAL_COMMUNITY)
Admission: RE | Admit: 2021-10-18 | Discharge: 2021-10-18 | Disposition: A | Discharge status: Home / Self Care | Payer: BC Managed Care – PPO | Source: Ambulatory Visit | Attending: Cardiology | Admitting: Cardiology

## 2021-10-18 DIAGNOSIS — R072 Precordial pain: Secondary | ICD-10-CM | POA: Insufficient documentation

## 2021-10-18 LAB — BASIC METABOLIC PANEL
BUN/Creatinine Ratio: 20 (ref 12–28)
BUN: 15 mg/dL (ref 8–27)
CO2: 25 mmol/L (ref 20–29)
Calcium: 9.3 mg/dL (ref 8.7–10.3)
Chloride: 106 mmol/L (ref 96–106)
Creatinine, Ser: 0.76 mg/dL (ref 0.57–1.00)
Glucose: 91 mg/dL (ref 70–99)
Potassium: 4.9 mmol/L (ref 3.5–5.2)
Sodium: 144 mmol/L (ref 134–144)
eGFR: 90 mL/min/{1.73_m2} (ref >59)

## 2021-10-18 LAB — LIPID PANEL
Chol/HDL Ratio: 3.2 ratio (ref 0.0–4.4)
Cholesterol, Total: 212 mg/dL — ABNORMAL HIGH (ref 100–199)
HDL: 66 mg/dL (ref >39)
LDL Chol Calc (NIH): 131 mg/dL — ABNORMAL HIGH (ref 0–99)
Triglycerides: 86 mg/dL (ref 0–149)
VLDL Cholesterol Cal: 15 mg/dL (ref 5–40)

## 2021-10-18 MED ORDER — IOHEXOL 350 MG/ML SOLN
100.0000 mL | Freq: Once | INTRAVENOUS | Status: AC | PRN
  Administered 2021-10-18: 95 mL via INTRAVENOUS

## 2021-10-18 MED ORDER — NITROGLYCERIN 0.4 MG SL SUBL
0.8000 mg | SUBLINGUAL_TABLET | Freq: Once | SUBLINGUAL | Status: AC
  Administered 2021-10-18: 0.8 mg via SUBLINGUAL

## 2021-10-18 MED ORDER — NITROGLYCERIN 0.4 MG SL SUBL
SUBLINGUAL_TABLET | SUBLINGUAL | Status: AC
  Filled 2021-10-18: qty 2

## 2021-11-22 ENCOUNTER — Encounter (HOSPITAL_BASED_OUTPATIENT_CLINIC_OR_DEPARTMENT_OTHER): Payer: Self-pay

## 2021-11-23 ENCOUNTER — Ambulatory Visit (HOSPITAL_BASED_OUTPATIENT_CLINIC_OR_DEPARTMENT_OTHER): Payer: BC Managed Care – PPO | Admitting: Cardiology

## 2022-03-09 DIAGNOSIS — L570 Actinic keratosis: Secondary | ICD-10-CM | POA: Diagnosis not present

## 2022-03-09 DIAGNOSIS — B351 Tinea unguium: Secondary | ICD-10-CM | POA: Diagnosis not present

## 2022-03-22 IMAGING — US US BREAST*R* LIMITED INC AXILLA
1 series · 2 of 2 positions shown · non-contrast
Comparison: Previous exam(s).

CLINICAL DATA: 59-year-old female presenting for evaluation of
right nipple inversion.

EXAM:
DIGITAL DIAGNOSTIC BILATERAL MAMMOGRAM WITH TOMO AND CAD; ULTRASOUND
RIGHT BREAST LIMITED

[Series 1: us breast*right* limited inc axilla · 0.06mm/px · 2 of 2 slices shown]
[im 1/2]
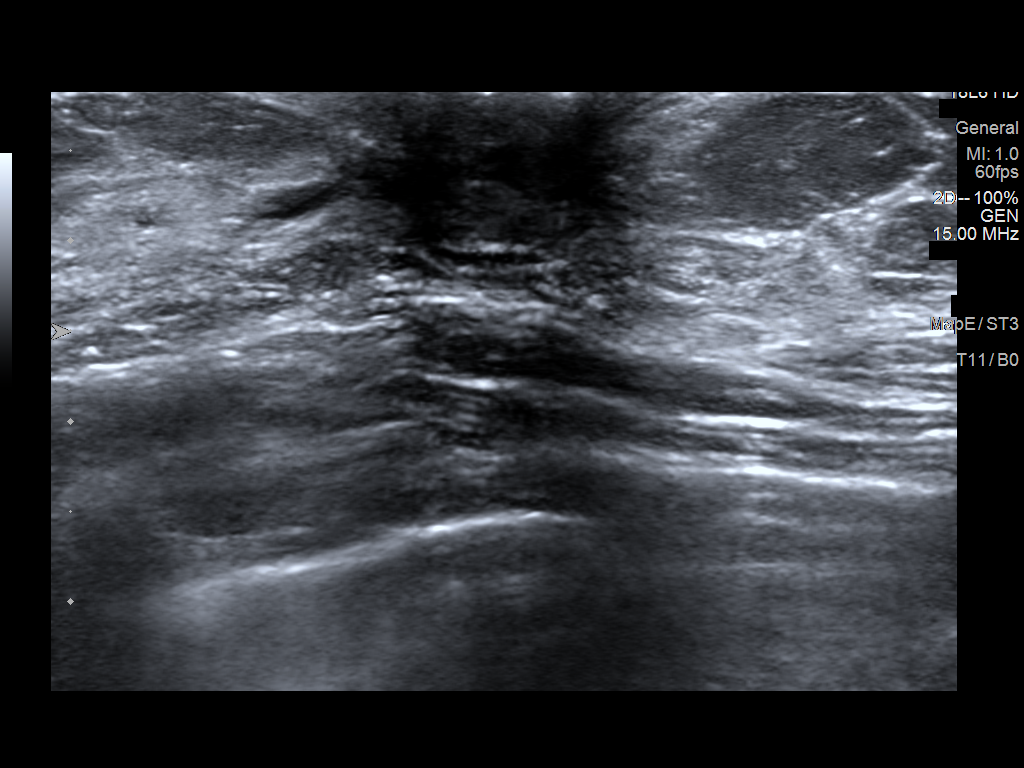
[im 2/2]
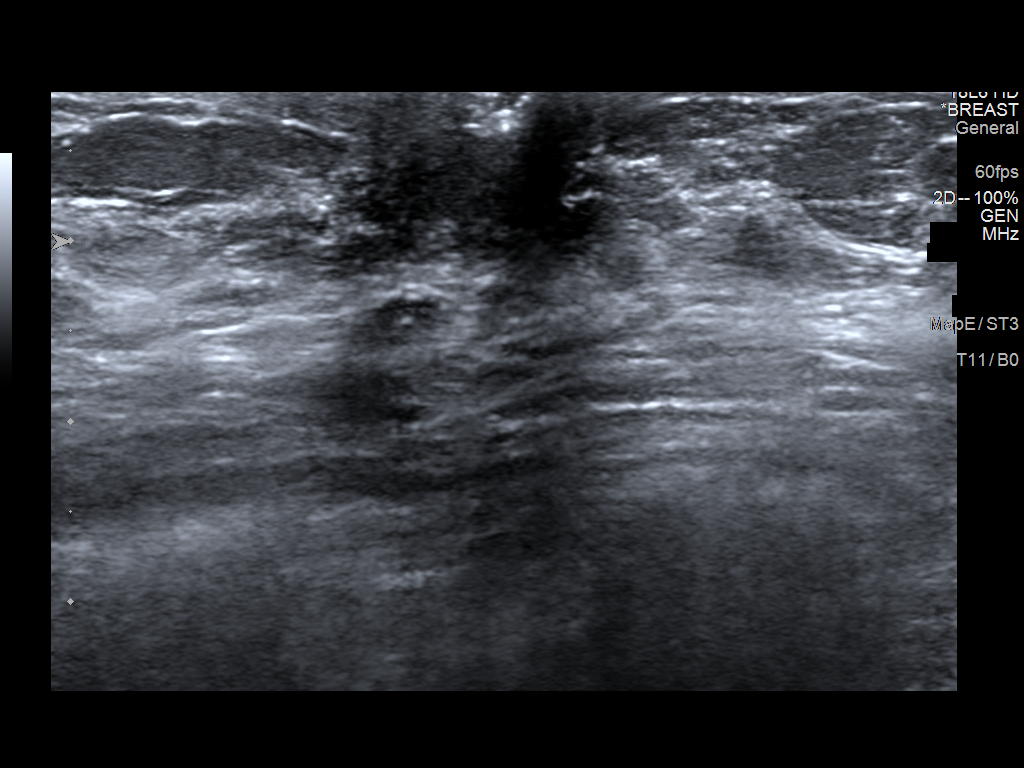

[2 of 2 positions shown; findings below may reference images not displayed]

ACR Breast Density Category c: The breast tissue is heterogeneously
dense, which may obscure small masses.
FINDINGS: No suspicious calcifications, masses or areas of distortion are seen
in the bilateral breasts.

Mammographic images were processed with CAD.

On physical exam, there is a slit like inversion centrally within
the right nipple, similar but mildly more pronounced than the slit
like inversion on the left.

Ultrasound targeted to the retroareolar right breast demonstrates
normal fibroglandular tissue. No suspicious masses or areas of
shadowing are identified.
IMPRESSION: 1. There are no mammographic or targeted sonographic abnormalities
in the retroareolar right breast to correspond with the patient's
concern for right nipple inversion.

2. No suspicious calcifications, masses or areas of distortion are
seen in the bilateral breasts.

RECOMMENDATION:
1. Clinical follow-up recommended for the right nipple inversion. As
the processes is bilateral but asymmetric, and slit like in
appearance, this is compatible with a benign process. Any further
workup should be based on clinical grounds.

2.  Screening mammogram in one year.(Code:[id number])

I have discussed the findings and recommendations with the patient.
If applicable, a reminder letter will be sent to the patient
regarding the next appointment.

BI-RADS CATEGORY  1: Negative.

## 2022-03-31 ENCOUNTER — Ambulatory Visit (HOSPITAL_BASED_OUTPATIENT_CLINIC_OR_DEPARTMENT_OTHER): Payer: BC Managed Care – PPO | Admitting: Cardiology

## 2022-06-15 ENCOUNTER — Ambulatory Visit (HOSPITAL_BASED_OUTPATIENT_CLINIC_OR_DEPARTMENT_OTHER): Payer: BC Managed Care – PPO | Admitting: Cardiology

## 2022-06-15 ENCOUNTER — Encounter (HOSPITAL_BASED_OUTPATIENT_CLINIC_OR_DEPARTMENT_OTHER): Payer: Self-pay | Admitting: Cardiology

## 2022-06-15 VITALS — BP 124/86 | HR 72 | Ht 64.0 in | Wt 141.4 lb

## 2022-06-15 DIAGNOSIS — I459 Conduction disorder, unspecified: Secondary | ICD-10-CM

## 2022-06-15 DIAGNOSIS — E7800 Pure hypercholesterolemia, unspecified: Secondary | ICD-10-CM | POA: Diagnosis not present

## 2022-06-15 DIAGNOSIS — Z8249 Family history of ischemic heart disease and other diseases of the circulatory system: Secondary | ICD-10-CM

## 2022-06-15 DIAGNOSIS — I2510 Atherosclerotic heart disease of native coronary artery without angina pectoris: Secondary | ICD-10-CM

## 2022-06-15 DIAGNOSIS — R011 Cardiac murmur, unspecified: Secondary | ICD-10-CM

## 2022-06-15 NOTE — Progress Notes (Signed)
Cardiology Office Note:    Date:  06/15/2022   ID:  Regina King, DOB 08/15/1961, MRN [id number]  PCP:  Regina Bayley, DO  Cardiologist:  Regina Dresser, MD  Referring MD: Regina Bayley, DO   CC: follow up  History of Present Illness:    Regina King is a 61 y.o. female with a hx of hypothyroidism, nonobstructive CAD who is seen for follow up. I initially met her 09/19/21 as a new consult at the request of Le, Thao P, DO for the evaluation and management of chest pain and palpitations.  At her last appointment she reported more stabbing chest pain while in [place], which self resolved. She was concerned about radiation and iodinated contrast. We discussed pros and cons together. After shared decision making, she agreed to pursue CT coronary angiography. This was performed 10/18/21 revealing a coronary calcium score of 28, nonobstructive CAD with mixed plaque in the proximal LAD causing minimal stenosis. Also noted mild aortic valve calcifications (AV calcium score 118).  Today: She may feel a little twinge of chest pain if she becomes very worried about something. This is usually infrequent, but occurred a couple times last week.  Additionally she complains of fatigue, which she attributes to a lack of sleep at night. Her prior dizziness and eye issues have not returned.  In July she went to see her son in [place]. They hiked for 12 miles with no anginal symptoms.  She follows a mostly vegetarian diet.  Very rarely she may experience a brief heaviness of one of her arms.   She denies any palpitations, or shortness of breath. No lightheadedness, headaches, syncope, orthopnea, or PND.   Past Medical History:  Diagnosis Date   Chest pain    Hypothyroidism    Migraine    Thyroid disease     History reviewed. No pertinent surgical history.  Current Medications: Current Outpatient Medications on File Prior to Visit  Medication Sig   Cholecalciferol (VITAMIN D3 PO) Take 2 tablets  by mouth daily.   levothyroxine (SYNTHROID) 88 MCG tablet Take 88 mcg by mouth daily before breakfast.   No current facility-administered medications on file prior to visit.     Allergies:   Asa [aspirin] and Peanut butter flavor   Social History   Tobacco Use   Smoking status: Never   Smokeless tobacco: Never  Substance Use Topics   Alcohol use: No   Drug use: No    Family History: family history includes Arrhythmia in her mother; Heart attack in her father.  ROS:   Please see the history of present illness.   (+) Rare chest discomfort (+) Fatigue Additional pertinent ROS otherwise unremarkable.   EKGs/Labs/Other Studies Reviewed:    The following studies were reviewed today:  Coronary CTA  10/18/2021: FINDINGS: Coronary Arteries:  Normal coronary origin.  Right dominance.   RCA is a large dominant artery that gives rise to PDA and PLA. There is no plaque.   Left main is a large artery that gives rise to LAD and LCX arteries.   LAD is a large vessel. There is mixed plaque in the proximal LAD causing 0-24% stenosis   LCX is a non-dominant artery.  There is no plaque.   Other findings:   Left Ventricle: Normal size  Left Atrium: Normal size  Pulmonary Veins: Normal configuration  Right Ventricle: Mild enlargement  Right Atrium: Mild enlargement  Cardiac valves: Mild AV calcifications (AV calcium score 118)   Thoracic aorta: Normal size  Pulmonary Arteries: Normal size  Systemic Veins: Normal drainage  Pericardium: Normal thickness   IMPRESSION: 1. Coronary calcium score of 28. This was 79th percentile for age and sex matched control.   2.  Normal coronary origin with right dominance.   3. Nonobstructive CAD with mixed plaque in the proximal LAD causing minimal (0-24%) stenosis   4.  Mild aortic valve calcifications (AV calcium score 118)   CAD-RADS 1. Minimal non-obstructive CAD (0-24%). Consider non-atherosclerotic causes of chest pain. Consider  preventive therapy and risk factor modification.  ETT 09/07/21   Patient exercised according to the CVN-BRUCE for 11:31mn achieving 13.4 METs   Resting HR was 63bpm and rose to a maximal HR of 148bpm which represents 92% of max, age-predicted HR   There were up sloping ST depression in the inferior and inferolateral leads was noted that resolved within 1 minute of recovery   Equivocal stress ECG due to upsloping ST-depressions during stress. Overall excellent exercise capacity. Consider imaging (lexiscan vs coronary CTA) for further ischemic work-up.  Bilateral Carotid Duplex 08/03/2021: FINDINGS: Criteria: Quantification of carotid stenosis is based on velocity parameters that correlate the residual internal carotid diameter with NASCET-based stenosis levels, using the diameter of the distal internal carotid lumen as the denominator for stenosis measurement.   The following velocity measurements were obtained:   RIGHT ICA: 60/27 cm/sec CCA: 701/09cm/sec  SYSTOLIC ICA/CCA RATIO:  03.23 ECA:  67 cm/sec   LEFT  ICA: 67/28 cm/sec  CCA: 655/73cm/sec  SYSTOLIC ICA/CCA RATIO:  1.0  ECA:  49 cm/sec   RIGHT CAROTID ARTERY: No significant atherosclerotic plaque or evidence of stenosis in the internal carotid artery.   RIGHT VERTEBRAL ARTERY:  Patent with normal antegrade flow.   LEFT CAROTID ARTERY: No significant atherosclerotic plaque or evidence of stenosis in the internal carotid artery.   LEFT VERTEBRAL ARTERY:  Patent with normal antegrade flow.   Other: Incidental note is made of a small subcentimeter hypoechoic nodule within the left thyroid gland. This is considered an incidental finding and no further follow-up imaging or additional imaging is required.   IMPRESSION: 1. No significant atherosclerotic plaque or evidence of stenosis in either internal carotid artery. 2. Vertebral arteries are patent with normal antegrade flow.  Echo report from [facility] 5.2.2019 Normal LVEF, mild LVH, normal wall motion, EF 63% AoV: mild calcification of NCC. No stenosis. Moderate AI TV: mild to moderate regurgitation No evidence of pulmonary hypertension  Stress echo 2015 Study Conclusions   - Stress ECG conclusions: Nonspecific upsloping ST    depression in anterolateral and inferior leads.  - Staged echo: Normal echo stress  Impressions:   - Normal study after maximal exercise. Good exercise    tolerance.  Bruce protocol. Stress echocardiography.  Height:  Height:  162.6cm. Height: 64in.  Weight:  Weight: 71.8kg. Weight:  158lb.  Body mass index:  BMI: 27.2kg/m^2.  Body surface  area:    BSA: 1.856m.  Blood pressure:     131/99.  Patient  status:  Outpatient.   EKG:  EKG is personally reviewed.   06/15/2022:  not ordered today 08/23/2021: NSR at 64 bpm   Recent Labs: 10/17/2021: BUN 15; Creatinine, Ser 0.76; Potassium 4.9; Sodium 144   Recent Lipid Panel    Component Value Date/Time   CHOL 212 (H) 10/17/2021 0916   TRIG 86 10/17/2021 0916   HDL 66 10/17/2021 0916   CHOLHDL 3.2 10/17/2021 0916   LDLCALC 131 (H) 10/17/2021 092202  Physical Exam:    VS:  BP 124/86 (BP Location: Right Arm, Patient Position: Sitting, Cuff Size: Normal)   Pulse 72   Ht '5\' 4"'  (1.626 m)   Wt 141 lb 6.4 oz (64.1 kg)   LMP 06/27/2012   SpO2 96%   BMI 24.27 kg/m     Wt Readings from Last 3 Encounters:  06/15/22 141 lb 6.4 oz (64.1 kg)  09/30/21 140 lb 12.8 oz (63.9 kg)  08/23/21 140 lb (63.5 kg)    GEN: Well nourished, well developed in no acute distress HEENT: Normal, moist mucous membranes NECK: No JVD CARDIAC: regular rhythm with rare skipped beats (two in a minute), normal S1 and S2, no rubs or gallops. 2/6 systolic murmur. VASCULAR: Radial and DP pulses 2+ bilaterally. No carotid bruits RESPIRATORY:  Clear to auscultation without rales, wheezing or rhonchi  ABDOMEN: Soft, non-tender, non-distended MUSCULOSKELETAL:  Ambulates  independently SKIN: Warm and dry, no edema NEUROLOGIC:  Alert and oriented x 3. No focal neuro deficits noted. PSYCHIATRIC:  Normal affect    ASSESSMENT:    1. Nonocclusive coronary atherosclerosis of native coronary artery   2. Family history of heart disease   3. Pure hypercholesterolemia   4. Skipped heart beats   5. Murmur, cardiac      PLAN:    Nonobstructive CAD Family history of heart disease Hypercholesterolemia -see CCTA results. Consistent with nonobstructive CAD -see prior notes; we have discussed guideline recommendations for statins, she declines  Skipped beats Palpitations, history of -rare on exam today, suspect PAC vs PVC -asymptomatic -no syncope, no red flag features  Murmur -CT with mild aortic calcification, suspect aortic sclerosis as etiology -had echo 2015 as part of echo stress test  Cardiac risk counseling and prevention recommendations: -has excellent lifestyle, nearly vegetarian (some fish) and active without cardiac limitations  Plan for follow up: 1 year or sooner as needed, per patient preference  Regina Dresser, MD, PhD, [facility]    Medication Adjustments/Labs and Tests Ordered: Current medicines are reviewed at length with the patient today.  Concerns regarding medicines are outlined above.   No orders of the defined types were placed in this encounter.  No orders of the defined types were placed in this encounter.  Patient Instructions  Medication Instructions:  Your Physician recommend you continue on your current medication as directed.    *If you need a refill on your cardiac medications before your next appointment, please call your pharmacy*   Lab Work: None ordered today   Testing/Procedures: None ordered today   Follow-Up: At [facility], you and your health needs are our priority.  As part of our continuing mission to provide you with exceptional heart care, we have  created designated Provider Care Teams.  These Care Teams include your primary Cardiologist (physician) and Advanced Practice Providers (APPs -  Physician Assistants and Nurse Practitioners) who all work together to provide you with the care you need, when you need it.  We recommend signing up for the patient portal called "MyChart".  Sign up information is provided on this After Visit Summary.  MyChart is used to connect with patients for Virtual Visits (Telemedicine).  Patients are able to view lab/test results, encounter notes, upcoming appointments, etc.  Non-urgent messages can be sent to your provider as well.   To learn more about what you can do with MyChart, go to [email or web address].    Your next appointment:   1 year(s)  The format  for your next appointment:   In Person  Provider:   Buford Dresser, MD             [facility] Stumpf,acting as a scribe for Regina Dresser, MD.,have documented all relevant documentation on the behalf of Regina Dresser, MD,as directed by  Regina Dresser, MD while in the presence of Regina Dresser, MD.  I, Regina Dresser, MD, have reviewed all documentation for this visit. The documentation on 06/15/22 for the exam, diagnosis, procedures, and orders are all accurate and complete.   Signed, Regina Dresser, MD PhD 06/15/2022 7:15 PM    [place]

## 2022-06-15 NOTE — Patient Instructions (Signed)
Medication Instructions:  Your Physician recommend you continue on your current medication as directed.    *If you need a refill on your cardiac medications before your next appointment, please call your pharmacy*   Lab Work: None ordered today   Testing/Procedures: None ordered today   Follow-Up: At [facility], you and your health needs are our priority.  As part of our continuing mission to provide you with exceptional heart care, we have created designated Provider Care Teams.  These Care Teams include your primary Cardiologist (physician) and Advanced Practice Providers (APPs -  Physician Assistants and Nurse Practitioners) who all work together to provide you with the care you need, when you need it.  We recommend signing up for the patient portal called "MyChart".  Sign up information is provided on this After Visit Summary.  MyChart is used to connect with patients for Virtual Visits (Telemedicine).  Patients are able to view lab/test results, encounter notes, upcoming appointments, etc.  Non-urgent messages can be sent to your provider as well.   To learn more about what you can do with MyChart, go to [email or web address].    Your next appointment:   1 year(s)  The format for your next appointment:   In Person  Provider:   Bridgette Christopher, MD          

## 2022-06-26 DIAGNOSIS — R1032 Left lower quadrant pain: Secondary | ICD-10-CM | POA: Diagnosis not present

## 2022-06-28 ENCOUNTER — Telehealth: Payer: Self-pay | Admitting: Cardiology

## 2022-06-28 NOTE — Telephone Encounter (Signed)
Should be ok to take

## 2022-06-28 NOTE — Telephone Encounter (Signed)
  Pt c/o medication issue:  1. Name of Medication: laxative pill and other solution for colonoscopy  2. How are you currently taking this medication (dosage and times per day)?   3. Are you having a reaction (difficulty breathing--STAT)?   4. What is your medication issue? Pt said, she was reading the label of the medications and solutions that she needs to take for her colonoscopy tomorrow and it says there that it may cause abnormal heart beat. She would like to ask Dr. Harrell Gave if its ok to take it since she has heart problem. She said she needs to start taking the pill at 12 noon and the solutions at 4 pm today

## 2022-06-28 NOTE — Telephone Encounter (Signed)
Please advise 

## 2022-06-29 DIAGNOSIS — K648 Other hemorrhoids: Secondary | ICD-10-CM | POA: Diagnosis not present

## 2022-06-29 DIAGNOSIS — R1032 Left lower quadrant pain: Secondary | ICD-10-CM | POA: Diagnosis not present

## 2022-06-29 DIAGNOSIS — D123 Benign neoplasm of transverse colon: Secondary | ICD-10-CM | POA: Diagnosis not present

## 2022-06-29 DIAGNOSIS — D122 Benign neoplasm of ascending colon: Secondary | ICD-10-CM | POA: Diagnosis not present

## 2022-06-29 DIAGNOSIS — K6389 Other specified diseases of intestine: Secondary | ICD-10-CM | POA: Diagnosis not present

## 2022-06-29 DIAGNOSIS — D125 Benign neoplasm of sigmoid colon: Secondary | ICD-10-CM | POA: Diagnosis not present

## 2022-06-29 DIAGNOSIS — K5730 Diverticulosis of large intestine without perforation or abscess without bleeding: Secondary | ICD-10-CM | POA: Diagnosis not present

## 2022-06-29 DIAGNOSIS — Z8371 Family history of colonic polyps: Secondary | ICD-10-CM | POA: Diagnosis not present

## 2022-06-29 DIAGNOSIS — Z1211 Encounter for screening for malignant neoplasm of colon: Secondary | ICD-10-CM | POA: Diagnosis not present

## 2022-09-11 DIAGNOSIS — E039 Hypothyroidism, unspecified: Secondary | ICD-10-CM | POA: Diagnosis not present

## 2022-09-11 DIAGNOSIS — E785 Hyperlipidemia, unspecified: Secondary | ICD-10-CM | POA: Diagnosis not present

## 2022-11-23 ENCOUNTER — Other Ambulatory Visit: Payer: Self-pay | Admitting: Family Medicine

## 2022-11-23 DIAGNOSIS — Z1231 Encounter for screening mammogram for malignant neoplasm of breast: Secondary | ICD-10-CM

## 2023-01-30 ENCOUNTER — Ambulatory Visit
Admission: RE | Admit: 2023-01-30 | Discharge: 2023-01-30 | Disposition: A | Discharge status: Home / Self Care | Payer: BC Managed Care – PPO | Source: Ambulatory Visit | Attending: Family Medicine | Admitting: Family Medicine

## 2023-01-30 ENCOUNTER — Other Ambulatory Visit: Payer: Self-pay | Admitting: Family Medicine

## 2023-01-30 DIAGNOSIS — Z1231 Encounter for screening mammogram for malignant neoplasm of breast: Secondary | ICD-10-CM

## 2023-03-27 DIAGNOSIS — E039 Hypothyroidism, unspecified: Secondary | ICD-10-CM | POA: Diagnosis not present

## 2023-07-10 DIAGNOSIS — B351 Tinea unguium: Secondary | ICD-10-CM | POA: Diagnosis not present

## 2023-07-10 DIAGNOSIS — L821 Other seborrheic keratosis: Secondary | ICD-10-CM | POA: Diagnosis not present

## 2023-07-10 DIAGNOSIS — L570 Actinic keratosis: Secondary | ICD-10-CM | POA: Diagnosis not present

## 2023-07-10 DIAGNOSIS — D1801 Hemangioma of skin and subcutaneous tissue: Secondary | ICD-10-CM | POA: Diagnosis not present

## 2023-07-10 DIAGNOSIS — B353 Tinea pedis: Secondary | ICD-10-CM | POA: Diagnosis not present

## 2023-07-25 DIAGNOSIS — E559 Vitamin D deficiency, unspecified: Secondary | ICD-10-CM | POA: Diagnosis not present

## 2023-07-25 DIAGNOSIS — Z13220 Encounter for screening for lipoid disorders: Secondary | ICD-10-CM | POA: Diagnosis not present

## 2023-07-25 DIAGNOSIS — Z0000 Encounter for general adult medical examination without abnormal findings: Secondary | ICD-10-CM | POA: Diagnosis not present

## 2023-07-25 DIAGNOSIS — E039 Hypothyroidism, unspecified: Secondary | ICD-10-CM | POA: Diagnosis not present

## 2023-07-25 DIAGNOSIS — Z124 Encounter for screening for malignant neoplasm of cervix: Secondary | ICD-10-CM | POA: Diagnosis not present

## 2023-10-30 DIAGNOSIS — E039 Hypothyroidism, unspecified: Secondary | ICD-10-CM | POA: Diagnosis not present

## 2024-01-22 DIAGNOSIS — E7800 Pure hypercholesterolemia, unspecified: Secondary | ICD-10-CM | POA: Diagnosis not present

## 2024-01-22 DIAGNOSIS — E559 Vitamin D deficiency, unspecified: Secondary | ICD-10-CM | POA: Diagnosis not present

## 2024-01-22 DIAGNOSIS — E039 Hypothyroidism, unspecified: Secondary | ICD-10-CM | POA: Diagnosis not present

## 2024-03-04 ENCOUNTER — Other Ambulatory Visit: Payer: Self-pay | Admitting: Physician Assistant

## 2024-03-04 DIAGNOSIS — Z1231 Encounter for screening mammogram for malignant neoplasm of breast: Secondary | ICD-10-CM

## 2024-03-05 ENCOUNTER — Ambulatory Visit
Admission: RE | Admit: 2024-03-05 | Discharge: 2024-03-05 | Disposition: A | Discharge status: Home / Self Care | Source: Ambulatory Visit | Attending: Physician Assistant | Admitting: Physician Assistant

## 2024-03-05 DIAGNOSIS — Z1231 Encounter for screening mammogram for malignant neoplasm of breast: Secondary | ICD-10-CM | POA: Diagnosis not present

## 2024-07-15 DIAGNOSIS — L821 Other seborrheic keratosis: Secondary | ICD-10-CM | POA: Diagnosis not present

## 2024-07-15 DIAGNOSIS — B351 Tinea unguium: Secondary | ICD-10-CM | POA: Diagnosis not present

## 2024-07-15 DIAGNOSIS — D1801 Hemangioma of skin and subcutaneous tissue: Secondary | ICD-10-CM | POA: Diagnosis not present

## 2024-07-31 DIAGNOSIS — E7800 Pure hypercholesterolemia, unspecified: Secondary | ICD-10-CM | POA: Diagnosis not present

## 2024-07-31 DIAGNOSIS — Z0189 Encounter for other specified special examinations: Secondary | ICD-10-CM | POA: Diagnosis not present

## 2024-07-31 DIAGNOSIS — H9190 Unspecified hearing loss, unspecified ear: Secondary | ICD-10-CM | POA: Diagnosis not present

## 2024-07-31 DIAGNOSIS — Z0000 Encounter for general adult medical examination without abnormal findings: Secondary | ICD-10-CM | POA: Diagnosis not present

## 2024-07-31 DIAGNOSIS — E039 Hypothyroidism, unspecified: Secondary | ICD-10-CM | POA: Diagnosis not present

## 2024-07-31 DIAGNOSIS — G4700 Insomnia, unspecified: Secondary | ICD-10-CM | POA: Diagnosis not present

## 2024-08-05 DIAGNOSIS — Z6824 Body mass index (BMI) 24.0-24.9, adult: Secondary | ICD-10-CM | POA: Diagnosis not present

## 2024-08-05 DIAGNOSIS — Z01419 Encounter for gynecological examination (general) (routine) without abnormal findings: Secondary | ICD-10-CM | POA: Diagnosis not present

## 2024-08-05 DIAGNOSIS — Z124 Encounter for screening for malignant neoplasm of cervix: Secondary | ICD-10-CM | POA: Diagnosis not present

## 2024-08-05 DIAGNOSIS — Z1151 Encounter for screening for human papillomavirus (HPV): Secondary | ICD-10-CM | POA: Diagnosis not present

## 2024-08-06 ENCOUNTER — Other Ambulatory Visit: Payer: Self-pay | Admitting: Nurse Practitioner

## 2024-08-06 ENCOUNTER — Ambulatory Visit
Admission: RE | Admit: 2024-08-06 | Discharge: 2024-08-06 | Disposition: A | Discharge status: Home / Self Care | Source: Ambulatory Visit | Attending: Nurse Practitioner | Admitting: Nurse Practitioner

## 2024-08-06 DIAGNOSIS — R109 Unspecified abdominal pain: Secondary | ICD-10-CM

## 2024-08-06 DIAGNOSIS — K5641 Fecal impaction: Secondary | ICD-10-CM | POA: Diagnosis not present

## 2024-08-21 DIAGNOSIS — H9313 Tinnitus, bilateral: Secondary | ICD-10-CM | POA: Diagnosis not present

## 2024-08-21 DIAGNOSIS — H93293 Other abnormal auditory perceptions, bilateral: Secondary | ICD-10-CM | POA: Diagnosis not present

## 2024-08-21 DIAGNOSIS — G43109 Migraine with aura, not intractable, without status migrainosus: Secondary | ICD-10-CM | POA: Diagnosis not present

## 2024-08-22 DIAGNOSIS — H93291 Other abnormal auditory perceptions, right ear: Secondary | ICD-10-CM | POA: Diagnosis not present

## 2024-08-27 ENCOUNTER — Other Ambulatory Visit: Payer: Self-pay | Admitting: Gastroenterology

## 2024-08-27 ENCOUNTER — Encounter: Payer: Self-pay | Admitting: Radiology

## 2024-08-27 ENCOUNTER — Encounter: Payer: Self-pay | Admitting: Neurology

## 2024-08-27 DIAGNOSIS — R1084 Generalized abdominal pain: Secondary | ICD-10-CM | POA: Diagnosis not present

## 2024-08-27 DIAGNOSIS — K5900 Constipation, unspecified: Secondary | ICD-10-CM | POA: Diagnosis not present

## 2024-08-29 ENCOUNTER — Ambulatory Visit
Admission: RE | Admit: 2024-08-29 | Discharge: 2024-08-29 | Disposition: A | Discharge status: Home / Self Care | Source: Ambulatory Visit | Attending: Gastroenterology | Admitting: Gastroenterology

## 2024-08-29 DIAGNOSIS — R1084 Generalized abdominal pain: Secondary | ICD-10-CM

## 2024-08-29 DIAGNOSIS — K5641 Fecal impaction: Secondary | ICD-10-CM | POA: Diagnosis not present

## 2024-08-29 MED ORDER — IOPAMIDOL (ISOVUE-300) INJECTION 61%
100.0000 mL | Freq: Once | INTRAVENOUS | Status: AC | PRN
  Administered 2024-08-29: 100 mL via INTRAVENOUS

## 2024-09-02 DIAGNOSIS — N898 Other specified noninflammatory disorders of vagina: Secondary | ICD-10-CM | POA: Diagnosis not present

## 2024-09-02 DIAGNOSIS — R1032 Left lower quadrant pain: Secondary | ICD-10-CM | POA: Diagnosis not present

## 2024-09-16 DIAGNOSIS — E039 Hypothyroidism, unspecified: Secondary | ICD-10-CM | POA: Diagnosis not present

## 2024-09-24 ENCOUNTER — Ambulatory Visit (HOSPITAL_BASED_OUTPATIENT_CLINIC_OR_DEPARTMENT_OTHER): Admitting: Cardiology

## 2024-09-24 ENCOUNTER — Encounter (HOSPITAL_BASED_OUTPATIENT_CLINIC_OR_DEPARTMENT_OTHER): Payer: Self-pay | Admitting: Cardiology

## 2024-09-24 VITALS — BP 126/88 | HR 70 | Ht 64.0 in | Wt 146.3 lb

## 2024-09-24 DIAGNOSIS — I2510 Atherosclerotic heart disease of native coronary artery without angina pectoris: Secondary | ICD-10-CM | POA: Diagnosis not present

## 2024-09-24 DIAGNOSIS — Z7189 Other specified counseling: Secondary | ICD-10-CM

## 2024-09-24 DIAGNOSIS — I10 Essential (primary) hypertension: Secondary | ICD-10-CM | POA: Diagnosis not present

## 2024-09-24 DIAGNOSIS — R011 Cardiac murmur, unspecified: Secondary | ICD-10-CM

## 2024-09-24 NOTE — Progress Notes (Signed)
 Cardiology Office Note:  .   Date:  09/24/2024  ID:  Regina King, DOB 22-Mar-1961, MRN [id number] PCP: Ladora Jaynie SQUIBB, DO  [facility] Providers Cardiologist:  Shelda Bruckner, MD {  History of Present Illness: .   Regina King is a 63 y.o. female with a hx of hypothyroidism, nonobstructive CAD who is seen for follow up. I initially met her 09/19/21 as a new consult at the request of Le, Thao P, DO for the evaluation and management of chest pain and palpitations.   Pertinent CV history:  CT coronary angiography 10/18/21 with coronary calcium score of 28, nonobstructive CAD with mixed plaque in the proximal LAD causing minimal stenosis. Also noted mild aortic valve calcifications (AV calcium score 118). Prior exercise treadmill 08/2021 had excellent exercise capacity but was equivocal with nonspecific ST pattern changes.  Today: Last January when she was in [place], she had recently had her levothyroxine changed, and her BP was very elevated, around 180/110. Returned back to prior dose of levothyroxine and BP improved. Has more recently had two episodes of elevated BP, after eating Indian food.  Has also noted relationship of her blood pressure to ambient temperature. Was prescribed hydrochlorothiazide 12.5 mg daily but hasn't started yet. Her BP off meds has been 108/78. Also struggling with constipation, does not want to exacerbate this.   Has rare chest pain, nonexertional, goes away with drinking warm water.  ROS: Denies shortness of breath at rest or with normal exertion. No PND, orthopnea, LE edema or unexpected weight gain. No syncope or palpitations. ROS otherwise negative except as noted.   Studies Reviewed: SABRA    EKG:  EKG Interpretation Date/Time:  Wednesday September 24 2024 15:24:33 EST Ventricular Rate:  70 PR Interval:  152 QRS Duration:  74 QT Interval:  418 QTC Calculation: 451 R Axis:   52  Text Interpretation: Normal sinus rhythm Confirmed by Bruckner Shelda [id number]) on 09/24/2024 4:10:25 PM    Physical Exam:   VS:  BP 126/88 (BP Location: Left Arm)   Pulse 70   Ht 5' 4 (1.626 m)   Wt 146 lb 4.8 oz (66.4 kg)   LMP 06/27/2012   SpO2 98%   BMI 25.11 kg/m    Wt Readings from Last 3 Encounters:  09/24/24 146 lb 4.8 oz (66.4 kg)  06/15/22 141 lb 6.4 oz (64.1 kg)  09/30/21 140 lb 12.8 oz (63.9 kg)    GEN: Well nourished, well developed in no acute distress HEENT: Normal, moist mucous membranes NECK: No JVD CARDIAC: regular rhythm, normal S1 and S2, no rubs or gallops. 2/6 systolic murmur. VASCULAR: Radial and DP pulses 2+ bilaterally. No carotid bruits RESPIRATORY:  Clear to auscultation without rales, wheezing or rhonchi  ABDOMEN: Soft, non-tender, non-distended MUSCULOSKELETAL:  Ambulates independently SKIN: Warm and dry, no edema NEUROLOGIC:  Alert and oriented x 3. No focal neuro deficits noted. PSYCHIATRIC:  Normal affect    ASSESSMENT AND PLAN: .    Nonobstructive CAD Family history of heart disease Hypercholesterolemia -CCTA results: consistent with nonobstructive CAD -see prior notes; we have discussed guideline recommendations for statins, she declines -discussed lpa, will hold on checking for now but discussed potential future therapies if elevated   Hypertension, intermittent -several events of isolated elevated blood pressure -has been prescribed hydrochlorothiazide 12.5 mg daily but has not started. Notes BP can be 100s/70s on no meds, but can also have elevated BP. I agree with not starting a daily medication at this time -she will  monitor BP, she will contact me if consistently elevated. Will also watch for triggers/pattern   Murmur -CT with mild aortic calcification, suspect aortic sclerosis as etiology -had echo 2015 as part of echo stress test   Cardiac risk counseling and prevention recommendations: -has excellent lifestyle, nearly vegetarian (some fish) and active without cardiac  limitations  Dispo: 1 year or sooner   Signed, Shelda Bruckner, MD   Shelda Bruckner, MD, PhD, [facility] [place]  [facility]  [place]  [facility] at [facility] [address], [address] [place], [place] [address] [phone]

## 2024-09-24 NOTE — Patient Instructions (Addendum)
 Medication Instructions:  No changes *If you need a refill on your cardiac medications before your next appointment, please call your pharmacy*  Lab Work: None ordered  Testing/Procedures: none  Follow-Up: At [facility], you and your health needs are our priority.  As part of our continuing mission to provide you with exceptional heart care, our providers are all part of one team.  This team includes your primary Cardiologist (physician) and Advanced Practice Providers or APPs (Physician Assistants and Nurse Practitioners) who all work together to provide you with the care you need, when you need it.  Your next appointment:   12 month(s)  Provider:   Shelda Bruckner, MD, Rosaline Bane, NP, or Reche Finder, NP

## 2024-10-24 ENCOUNTER — Ambulatory Visit (HOSPITAL_BASED_OUTPATIENT_CLINIC_OR_DEPARTMENT_OTHER): Admitting: Cardiology

## 2024-10-31 ENCOUNTER — Encounter: Payer: Self-pay | Admitting: Neurology

## 2024-12-18 ENCOUNTER — Ambulatory Visit: Admitting: Neurology
